# Patient Record
Sex: Female | Born: 1945 | Race: White | Hispanic: No | Marital: Married | Smoking: Former smoker
Health system: Southern US, Community
[De-identification: ages and names within clinical notes are randomized; demographics above are authoritative.]

## PROBLEM LIST (undated history)

## (undated) DIAGNOSIS — D649 Anemia, unspecified: Secondary | ICD-10-CM

## (undated) DIAGNOSIS — C189 Malignant neoplasm of colon, unspecified: Secondary | ICD-10-CM

## (undated) DIAGNOSIS — I10 Essential (primary) hypertension: Secondary | ICD-10-CM

## (undated) DIAGNOSIS — F419 Anxiety disorder, unspecified: Secondary | ICD-10-CM

## (undated) DIAGNOSIS — E559 Vitamin D deficiency, unspecified: Secondary | ICD-10-CM

## (undated) DIAGNOSIS — K631 Perforation of intestine (nontraumatic): Secondary | ICD-10-CM

## (undated) HISTORY — DX: Essential (primary) hypertension: I10

## (undated) HISTORY — DX: Anemia, unspecified: D64.9

## (undated) HISTORY — PX: COLON SURGERY: SHX602

## (undated) HISTORY — PX: BREAST LUMPECTOMY: SHX2

## (undated) HISTORY — DX: Vitamin D deficiency, unspecified: E55.9

## (undated) HISTORY — DX: Anxiety disorder, unspecified: F41.9

---

## 1999-08-22 ENCOUNTER — Encounter: Payer: Self-pay | Admitting: Family Medicine

## 1999-08-22 ENCOUNTER — Encounter (INDEPENDENT_AMBULATORY_CARE_PROVIDER_SITE_OTHER): Payer: Self-pay | Admitting: Specialist

## 1999-08-22 ENCOUNTER — Ambulatory Visit (HOSPITAL_COMMUNITY): Admission: RE | Admit: 1999-08-22 | Discharge: 1999-08-22 | Payer: Self-pay | Admitting: Family Medicine

## 2000-07-14 ENCOUNTER — Ambulatory Visit (HOSPITAL_COMMUNITY): Admission: RE | Admit: 2000-07-14 | Discharge: 2000-07-14 | Payer: Self-pay | Admitting: Family Medicine

## 2000-07-14 ENCOUNTER — Encounter: Payer: Self-pay | Admitting: Family Medicine

## 2001-07-28 ENCOUNTER — Ambulatory Visit (HOSPITAL_COMMUNITY): Admission: RE | Admit: 2001-07-28 | Discharge: 2001-07-28 | Payer: Self-pay | Admitting: Family Medicine

## 2001-07-28 ENCOUNTER — Encounter: Payer: Self-pay | Admitting: Family Medicine

## 2002-08-04 ENCOUNTER — Ambulatory Visit (HOSPITAL_COMMUNITY): Admission: RE | Admit: 2002-08-04 | Discharge: 2002-08-04 | Payer: Self-pay | Admitting: Family Medicine

## 2002-08-04 ENCOUNTER — Encounter: Payer: Self-pay | Admitting: Family Medicine

## 2003-08-07 ENCOUNTER — Ambulatory Visit (HOSPITAL_COMMUNITY): Admission: RE | Admit: 2003-08-07 | Discharge: 2003-08-07 | Payer: Self-pay | Admitting: Family Medicine

## 2004-09-17 ENCOUNTER — Ambulatory Visit (HOSPITAL_COMMUNITY): Admission: RE | Admit: 2004-09-17 | Discharge: 2004-09-17 | Payer: Self-pay | Admitting: Family Medicine

## 2005-10-08 ENCOUNTER — Ambulatory Visit (HOSPITAL_COMMUNITY): Admission: RE | Admit: 2005-10-08 | Discharge: 2005-10-08 | Payer: Self-pay | Admitting: Family Medicine

## 2006-12-31 ENCOUNTER — Ambulatory Visit (HOSPITAL_COMMUNITY): Admission: RE | Admit: 2006-12-31 | Discharge: 2006-12-31 | Payer: Self-pay | Admitting: Family Medicine

## 2007-11-16 ENCOUNTER — Ambulatory Visit (HOSPITAL_COMMUNITY): Admission: RE | Admit: 2007-11-16 | Discharge: 2007-11-16 | Payer: Self-pay | Admitting: Urology

## 2008-01-19 ENCOUNTER — Ambulatory Visit (HOSPITAL_COMMUNITY): Admission: RE | Admit: 2008-01-19 | Discharge: 2008-01-19 | Payer: Self-pay | Admitting: Family Medicine

## 2008-12-21 ENCOUNTER — Encounter: Admission: RE | Admit: 2008-12-21 | Discharge: 2008-12-21 | Payer: Self-pay | Admitting: Family Medicine

## 2009-01-22 ENCOUNTER — Ambulatory Visit (HOSPITAL_COMMUNITY): Admission: RE | Admit: 2009-01-22 | Discharge: 2009-01-22 | Payer: Self-pay | Admitting: Family Medicine

## 2010-03-08 ENCOUNTER — Ambulatory Visit (HOSPITAL_COMMUNITY): Admission: RE | Admit: 2010-03-08 | Discharge: 2010-03-08 | Payer: Self-pay | Admitting: Family Medicine

## 2010-06-16 ENCOUNTER — Encounter: Payer: Self-pay | Admitting: Family Medicine

## 2011-02-26 ENCOUNTER — Other Ambulatory Visit: Payer: Self-pay | Admitting: Family Medicine

## 2011-02-26 DIAGNOSIS — Z1231 Encounter for screening mammogram for malignant neoplasm of breast: Secondary | ICD-10-CM

## 2011-03-12 ENCOUNTER — Ambulatory Visit (HOSPITAL_COMMUNITY)
Admission: RE | Admit: 2011-03-12 | Discharge: 2011-03-12 | Disposition: A | Discharge status: Home / Self Care | Payer: Medicare Other | Source: Ambulatory Visit | Attending: Family Medicine | Admitting: Family Medicine

## 2011-03-12 DIAGNOSIS — Z1231 Encounter for screening mammogram for malignant neoplasm of breast: Secondary | ICD-10-CM

## 2012-03-09 ENCOUNTER — Other Ambulatory Visit: Payer: Self-pay | Admitting: Family Medicine

## 2012-03-09 DIAGNOSIS — Z1231 Encounter for screening mammogram for malignant neoplasm of breast: Secondary | ICD-10-CM

## 2012-03-26 ENCOUNTER — Ambulatory Visit (HOSPITAL_COMMUNITY): Payer: Medicare Other

## 2012-05-27 ENCOUNTER — Ambulatory Visit (HOSPITAL_COMMUNITY): Payer: Medicare Other

## 2012-11-14 ENCOUNTER — Other Ambulatory Visit (HOSPITAL_COMMUNITY): Payer: Self-pay | Admitting: Family Medicine

## 2012-11-20 ENCOUNTER — Other Ambulatory Visit (HOSPITAL_COMMUNITY): Payer: Self-pay | Admitting: Family Medicine

## 2012-11-28 ENCOUNTER — Other Ambulatory Visit (HOSPITAL_COMMUNITY): Payer: Self-pay | Admitting: Family Medicine

## 2012-11-30 NOTE — Telephone Encounter (Signed)
Last seen by ACM 03/13, she has been told several times to be seen

## 2013-01-19 ENCOUNTER — Other Ambulatory Visit (HOSPITAL_COMMUNITY): Payer: Self-pay | Admitting: Family Medicine

## 2013-01-20 NOTE — Telephone Encounter (Signed)
Last seen 08/20/11  ACM

## 2013-02-14 ENCOUNTER — Other Ambulatory Visit (HOSPITAL_COMMUNITY): Payer: Self-pay | Admitting: Family Medicine

## 2013-02-16 NOTE — Telephone Encounter (Signed)
You may give this patient 14 pills which is enough for 2 weeks and schedule her visit to see a provider because she has not been seen since 3-13

## 2013-02-16 NOTE — Telephone Encounter (Signed)
PT NOT SEEN SINCE 3/13. NTBS

## 2013-03-10 ENCOUNTER — Other Ambulatory Visit (HOSPITAL_COMMUNITY): Payer: Self-pay | Admitting: Family Medicine

## 2013-03-15 ENCOUNTER — Other Ambulatory Visit: Payer: Self-pay

## 2013-03-16 ENCOUNTER — Other Ambulatory Visit (HOSPITAL_COMMUNITY): Payer: Self-pay | Admitting: Family Medicine

## 2013-04-24 ENCOUNTER — Other Ambulatory Visit (HOSPITAL_COMMUNITY): Payer: Self-pay | Admitting: Family Medicine

## 2013-04-26 NOTE — Telephone Encounter (Signed)
ntbs

## 2013-04-26 NOTE — Telephone Encounter (Signed)
Last seen 08/20/11  ACM

## 2013-05-24 ENCOUNTER — Other Ambulatory Visit (HOSPITAL_COMMUNITY): Payer: Self-pay | Admitting: Nurse Practitioner

## 2013-06-03 ENCOUNTER — Other Ambulatory Visit: Payer: Self-pay | Admitting: *Deleted

## 2013-06-03 MED ORDER — ATENOLOL 50 MG PO TABS: ORAL_TABLET | ORAL | Status: DC

## 2013-06-03 NOTE — Telephone Encounter (Signed)
No more refills without making an appointment

## 2013-06-03 NOTE — Telephone Encounter (Signed)
Patient has not been seen since 08-20-11. Please advise

## 2013-07-02 ENCOUNTER — Other Ambulatory Visit: Payer: Self-pay | Admitting: Nurse Practitioner

## 2013-07-11 ENCOUNTER — Other Ambulatory Visit: Payer: Self-pay

## 2013-07-12 ENCOUNTER — Other Ambulatory Visit: Payer: Self-pay | Admitting: Nurse Practitioner

## 2013-07-18 ENCOUNTER — Other Ambulatory Visit: Payer: Self-pay | Admitting: *Deleted

## 2013-07-18 ENCOUNTER — Other Ambulatory Visit: Payer: Self-pay | Admitting: Nurse Practitioner

## 2013-07-18 ENCOUNTER — Other Ambulatory Visit (HOSPITAL_COMMUNITY): Payer: Self-pay | Admitting: Family Medicine

## 2013-07-18 MED ORDER — ATENOLOL 50 MG PO TABS: ORAL_TABLET | ORAL | Status: DC

## 2013-07-18 NOTE — Telephone Encounter (Signed)
Please make sure that patient makes an appointment to be seen in follow up for her blood pressure

## 2013-08-15 ENCOUNTER — Other Ambulatory Visit: Payer: Self-pay | Admitting: Family Medicine

## 2013-08-22 ENCOUNTER — Other Ambulatory Visit: Payer: Self-pay | Admitting: Family Medicine

## 2013-08-23 ENCOUNTER — Telehealth: Payer: Self-pay | Admitting: Family Medicine

## 2013-08-23 MED ORDER — ATENOLOL 50 MG PO TABS: ORAL_TABLET | ORAL | Status: DC

## 2013-08-23 NOTE — Telephone Encounter (Signed)
done

## 2013-08-29 ENCOUNTER — Ambulatory Visit: Payer: Self-pay | Admitting: Family Medicine

## 2013-09-19 ENCOUNTER — Other Ambulatory Visit: Payer: Self-pay | Admitting: Family Medicine

## 2013-09-22 ENCOUNTER — Other Ambulatory Visit: Payer: Self-pay | Admitting: Family Medicine

## 2013-10-06 ENCOUNTER — Other Ambulatory Visit: Payer: Self-pay

## 2013-10-07 ENCOUNTER — Telehealth: Payer: Self-pay | Admitting: Family Medicine

## 2013-10-10 NOTE — Telephone Encounter (Signed)
PT MADE APT FOR 10/11/13

## 2013-10-11 ENCOUNTER — Ambulatory Visit (INDEPENDENT_AMBULATORY_CARE_PROVIDER_SITE_OTHER): Payer: Commercial Managed Care - HMO | Admitting: Physician Assistant

## 2013-10-11 ENCOUNTER — Encounter: Payer: Self-pay | Admitting: Physician Assistant

## 2013-10-11 VITALS — BP 157/82 | HR 107 | Temp 99.0°F | Ht 62.0 in | Wt 142.2 lb

## 2013-10-11 DIAGNOSIS — I10 Essential (primary) hypertension: Secondary | ICD-10-CM

## 2013-10-11 DIAGNOSIS — D649 Anemia, unspecified: Secondary | ICD-10-CM

## 2013-10-11 HISTORY — DX: Essential (primary) hypertension: I10

## 2013-10-11 LAB — POCT CBC
Granulocyte percent: 74.5 %G (ref 37–80)
HEMATOCRIT: 30.4 % — AB (ref 37.7–47.9)
HEMOGLOBIN: 10.6 g/dL — AB (ref 12.2–16.2)
Lymph, poc: 1.6 (ref 0.6–3.4)
MCH: 30.4 pg (ref 27–31.2)
MCHC: 35 g/dL (ref 31.8–35.4)
MCV: 86.9 fL (ref 80–97)
MPV: 7.7 fL (ref 0–99.8)
PLATELET COUNT, POC: 254 10*3/uL (ref 142–424)
POC Granulocyte: 6.4 (ref 2–6.9)
POC LYMPH PERCENT: 18.9 %L (ref 10–50)
RBC: 3.5 M/uL — AB (ref 4.04–5.48)
RDW, POC: 14 %
WBC: 8.6 10*3/uL (ref 4.6–10.2)

## 2013-10-11 MED ORDER — ENALAPRIL MALEATE 10 MG PO TABS: ORAL_TABLET | ORAL | Status: DC

## 2013-10-11 MED ORDER — ATENOLOL 50 MG PO TABS: ORAL_TABLET | ORAL | Status: DC

## 2013-10-11 NOTE — Progress Notes (Signed)
Subjective:     Patient ID: Erika Diaz, female   DOB: February 07, 1946, 68 y.o.   MRN: [id number]  HPI Pt here for re-eval of HTN P tout of meds for several days Pt not seen since 2013 in her paper chart She has no complaints  Review of Systems Denies wgt gain/loss No CP/SOB No lower ext edema No change in stamina noted    Objective:   Physical Exam Vitals reviewed No JVD/bruits Heart- RRR w/o M Lungs- CTA bilat Pulses equal in the upper ext No lower ext edema BMP,CBC, and lipid/liver pending    Assessment:     HTN Anemia    Plan:     Discussed with pt given her chronic conditions she will have to be seen on a regular basis Continue with eating well and exercising Will inform of lab results F/U pending labs RF of meds x 6 months

## 2013-10-12 LAB — LIPID PANEL
CHOL/HDL RATIO: 1.7 ratio (ref 0.0–4.4)
CHOLESTEROL TOTAL: 195 mg/dL (ref 100–199)
HDL: 118 mg/dL (ref >39)
LDL CALC: 65 mg/dL (ref 0–99)
TRIGLYCERIDES: 58 mg/dL (ref 0–149)
VLDL CHOLESTEROL CAL: 12 mg/dL (ref 5–40)

## 2013-10-12 LAB — BMP8+EGFR
BUN/Creatinine Ratio: 12 (ref 11–26)
BUN: 12 mg/dL (ref 8–27)
CO2: 20 mmol/L (ref 18–29)
CREATININE: 0.98 mg/dL (ref 0.57–1.00)
Calcium: 9.9 mg/dL (ref 8.7–10.3)
Chloride: 92 mmol/L — ABNORMAL LOW (ref 97–108)
GFR calc Af Amer: 69 mL/min/{1.73_m2} (ref >59)
GFR, EST NON AFRICAN AMERICAN: 59 mL/min/{1.73_m2} — AB (ref >59)
Glucose: 119 mg/dL — ABNORMAL HIGH (ref 65–99)
Potassium: 4.6 mmol/L (ref 3.5–5.2)
SODIUM: 128 mmol/L — AB (ref 134–144)

## 2013-10-12 LAB — HEPATIC FUNCTION PANEL
ALBUMIN: 4.6 g/dL (ref 3.6–4.8)
ALK PHOS: 79 IU/L (ref 39–117)
ALT: 14 IU/L (ref 0–32)
AST: 19 IU/L (ref 0–40)
BILIRUBIN DIRECT: 0.14 mg/dL (ref 0.00–0.40)
BILIRUBIN TOTAL: 0.4 mg/dL (ref 0.0–1.2)
TOTAL PROTEIN: 7.3 g/dL (ref 6.0–8.5)

## 2013-11-27 ENCOUNTER — Emergency Department (HOSPITAL_COMMUNITY): Payer: Medicare HMO

## 2013-11-27 ENCOUNTER — Encounter (HOSPITAL_COMMUNITY): Payer: Self-pay | Admitting: Emergency Medicine

## 2013-11-27 ENCOUNTER — Inpatient Hospital Stay (HOSPITAL_COMMUNITY)
Admission: EM | Admit: 2013-11-27 | Discharge: 2013-12-01 | DRG: 470 | Disposition: A | Discharge status: Skilled Nursing Facility | Payer: Medicare HMO | Attending: Family Medicine | Admitting: Family Medicine

## 2013-11-27 DIAGNOSIS — S72001A Fracture of unspecified part of neck of right femur, initial encounter for closed fracture: Secondary | ICD-10-CM

## 2013-11-27 DIAGNOSIS — S72009A Fracture of unspecified part of neck of unspecified femur, initial encounter for closed fracture: Principal | ICD-10-CM | POA: Diagnosis present

## 2013-11-27 DIAGNOSIS — I10 Essential (primary) hypertension: Secondary | ICD-10-CM | POA: Diagnosis present

## 2013-11-27 DIAGNOSIS — Z91199 Patient's noncompliance with other medical treatment and regimen due to unspecified reason: Secondary | ICD-10-CM

## 2013-11-27 DIAGNOSIS — S7291XA Unspecified fracture of right femur, initial encounter for closed fracture: Secondary | ICD-10-CM

## 2013-11-27 DIAGNOSIS — D649 Anemia, unspecified: Secondary | ICD-10-CM

## 2013-11-27 DIAGNOSIS — D509 Iron deficiency anemia, unspecified: Secondary | ICD-10-CM | POA: Diagnosis present

## 2013-11-27 DIAGNOSIS — D6489 Other specified anemias: Secondary | ICD-10-CM

## 2013-11-27 DIAGNOSIS — F0390 Unspecified dementia without behavioral disturbance: Secondary | ICD-10-CM | POA: Diagnosis present

## 2013-11-27 DIAGNOSIS — Z833 Family history of diabetes mellitus: Secondary | ICD-10-CM

## 2013-11-27 DIAGNOSIS — Z9119 Patient's noncompliance with other medical treatment and regimen: Secondary | ICD-10-CM

## 2013-11-27 DIAGNOSIS — F10920 Alcohol use, unspecified with intoxication, uncomplicated: Secondary | ICD-10-CM

## 2013-11-27 DIAGNOSIS — F1010 Alcohol abuse, uncomplicated: Secondary | ICD-10-CM

## 2013-11-27 DIAGNOSIS — F1020 Alcohol dependence, uncomplicated: Secondary | ICD-10-CM | POA: Diagnosis present

## 2013-11-27 DIAGNOSIS — D72829 Elevated white blood cell count, unspecified: Secondary | ICD-10-CM | POA: Diagnosis present

## 2013-11-27 DIAGNOSIS — E871 Hypo-osmolality and hyponatremia: Secondary | ICD-10-CM

## 2013-11-27 DIAGNOSIS — E876 Hypokalemia: Secondary | ICD-10-CM | POA: Diagnosis not present

## 2013-11-27 DIAGNOSIS — W108XXA Fall (on) (from) other stairs and steps, initial encounter: Secondary | ICD-10-CM | POA: Diagnosis present

## 2013-11-27 DIAGNOSIS — Z87891 Personal history of nicotine dependence: Secondary | ICD-10-CM

## 2013-11-27 HISTORY — DX: Hypo-osmolality and hyponatremia: E87.1

## 2013-11-27 HISTORY — DX: Alcohol abuse, uncomplicated: F10.10

## 2013-11-27 LAB — BASIC METABOLIC PANEL
ANION GAP: 18 — AB (ref 5–15)
Anion gap: 17 — ABNORMAL HIGH (ref 5–15)
BUN: 6 mg/dL (ref 6–23)
BUN: 5 mg/dL — AB (ref 6–23)
CALCIUM: 8.9 mg/dL (ref 8.4–10.5)
CO2: 19 meq/L (ref 19–32)
CO2: 20 mEq/L (ref 19–32)
CREATININE: 0.72 mg/dL (ref 0.50–1.10)
CREATININE: 0.7 mg/dL (ref 0.50–1.10)
Calcium: 8.6 mg/dL (ref 8.4–10.5)
Chloride: 84 mEq/L — ABNORMAL LOW (ref 96–112)
Chloride: 86 mEq/L — ABNORMAL LOW (ref 96–112)
GFR calc Af Amer: >90 mL/min (ref >90)
GFR calc non Af Amer: 86 mL/min — ABNORMAL LOW (ref >90)
GFR, EST NON AFRICAN AMERICAN: 87 mL/min — AB (ref >90)
Glucose, Bld: 136 mg/dL — ABNORMAL HIGH (ref 70–99)
Glucose, Bld: 118 mg/dL — ABNORMAL HIGH (ref 70–99)
Potassium: 3.9 mEq/L (ref 3.7–5.3)
Potassium: 3.9 mEq/L (ref 3.7–5.3)
SODIUM: 121 meq/L — AB (ref 137–147)
Sodium: 123 mEq/L — ABNORMAL LOW (ref 137–147)

## 2013-11-27 LAB — CBC WITH DIFFERENTIAL/PLATELET
Basophils Absolute: 0 10*3/uL (ref 0.0–0.1)
Basophils Relative: 0 % (ref 0–1)
Eosinophils Absolute: 0 10*3/uL (ref 0.0–0.7)
Eosinophils Relative: 0 % (ref 0–5)
HCT: 28.7 % — ABNORMAL LOW (ref 36.0–46.0)
Hemoglobin: 10.1 g/dL — ABNORMAL LOW (ref 12.0–15.0)
LYMPHS ABS: 1.4 10*3/uL (ref 0.7–4.0)
LYMPHS PCT: 11 % — AB (ref 12–46)
MCH: 29.2 pg (ref 26.0–34.0)
MCHC: 35.2 g/dL (ref 30.0–36.0)
MCV: 82.9 fL (ref 78.0–100.0)
Monocytes Absolute: 0.8 10*3/uL (ref 0.1–1.0)
Monocytes Relative: 6 % (ref 3–12)
NEUTROS ABS: 10.8 10*3/uL — AB (ref 1.7–7.7)
NEUTROS PCT: 83 % — AB (ref 43–77)
PLATELETS: 268 10*3/uL (ref 150–400)
RBC: 3.46 MIL/uL — AB (ref 3.87–5.11)
RDW: 14.6 % (ref 11.5–15.5)
WBC: 13.1 10*3/uL — AB (ref 4.0–10.5)

## 2013-11-27 LAB — CBC
HEMATOCRIT: 28.2 % — AB (ref 36.0–46.0)
Hemoglobin: 9.9 g/dL — ABNORMAL LOW (ref 12.0–15.0)
MCH: 29.1 pg (ref 26.0–34.0)
MCHC: 35.1 g/dL (ref 30.0–36.0)
MCV: 82.9 fL (ref 78.0–100.0)
Platelets: 267 10*3/uL (ref 150–400)
RBC: 3.4 MIL/uL — ABNORMAL LOW (ref 3.87–5.11)
RDW: 14.4 % (ref 11.5–15.5)
WBC: 12.5 10*3/uL — ABNORMAL HIGH (ref 4.0–10.5)

## 2013-11-27 LAB — HEPATIC FUNCTION PANEL
ALT: 22 U/L (ref 0–35)
AST: 23 U/L (ref 0–37)
Albumin: 3.6 g/dL (ref 3.5–5.2)
Alkaline Phosphatase: 83 U/L (ref 39–117)
BILIRUBIN TOTAL: 0.2 mg/dL — AB (ref 0.3–1.2)
Total Protein: 7.1 g/dL (ref 6.0–8.3)

## 2013-11-27 LAB — RETICULOCYTES
RBC.: 3.36 MIL/uL — AB (ref 3.87–5.11)
RETIC CT PCT: 1.2 % (ref 0.4–3.1)
Retic Count, Absolute: 40.3 10*3/uL (ref 19.0–186.0)

## 2013-11-27 LAB — FERRITIN: Ferritin: 18 ng/mL (ref 10–291)

## 2013-11-27 LAB — URINALYSIS, ROUTINE W REFLEX MICROSCOPIC
Bilirubin Urine: NEGATIVE
Glucose, UA: NEGATIVE mg/dL
Hgb urine dipstick: NEGATIVE
Ketones, ur: NEGATIVE mg/dL
LEUKOCYTES UA: NEGATIVE
NITRITE: NEGATIVE
PROTEIN: NEGATIVE mg/dL
UROBILINOGEN UA: 0.2 mg/dL (ref 0.0–1.0)
pH: 6 (ref 5.0–8.0)

## 2013-11-27 LAB — SURGICAL PCR SCREEN
MRSA, PCR: NEGATIVE
Staphylococcus aureus: NEGATIVE

## 2013-11-27 LAB — IRON AND TIBC
IRON: 43 ug/dL (ref 42–135)
Saturation Ratios: 14 % — ABNORMAL LOW (ref 20–55)
TIBC: 312 ug/dL (ref 250–470)
UIBC: 269 ug/dL (ref 125–400)

## 2013-11-27 LAB — ETHANOL: ALCOHOL ETHYL (B): 190 mg/dL — AB (ref 0–11)

## 2013-11-27 LAB — FOLATE: Folate: 18.3 ng/mL

## 2013-11-27 LAB — VITAMIN B12: Vitamin B-12: 360 pg/mL (ref 211–911)

## 2013-11-27 LAB — PREPARE RBC (CROSSMATCH)

## 2013-11-27 LAB — ABO/RH: ABO/RH(D): AB POS

## 2013-11-27 LAB — OSMOLALITY: Osmolality: 269 mOsm/kg — ABNORMAL LOW (ref 275–300)

## 2013-11-27 MED ORDER — HYDROCODONE-ACETAMINOPHEN 5-325 MG PO TABS
1.0000 | ORAL_TABLET | Freq: Four times a day (QID) | ORAL | Status: DC | PRN
  Administered 2013-11-27 – 2013-11-30 (×5): 1 via ORAL
  Administered 2013-11-30: 2 via ORAL
  Administered 2013-11-30 – 2013-12-01 (×2): 1 via ORAL
  Filled 2013-11-27 (×8): qty 1
  Filled 2013-11-27: qty 2

## 2013-11-27 MED ORDER — ADULT MULTIVITAMIN W/MINERALS CH
1.0000 | ORAL_TABLET | Freq: Every day | ORAL | Status: DC
  Administered 2013-11-28 – 2013-11-29 (×2): 1 via ORAL
  Filled 2013-11-27 (×4): qty 1

## 2013-11-27 MED ORDER — MORPHINE SULFATE 2 MG/ML IJ SOLN
0.5000 mg | INTRAMUSCULAR | Status: DC | PRN
  Administered 2013-11-27 – 2013-11-29 (×10): 0.5 mg via INTRAVENOUS
  Filled 2013-11-27 (×10): qty 1

## 2013-11-27 MED ORDER — FOLIC ACID 1 MG PO TABS
1.0000 mg | ORAL_TABLET | Freq: Every day | ORAL | Status: DC
  Administered 2013-11-27 – 2013-12-01 (×4): 1 mg via ORAL
  Filled 2013-11-27 (×5): qty 1

## 2013-11-27 MED ORDER — LORAZEPAM 2 MG/ML IJ SOLN: 1.0000 mg | Freq: Four times a day (QID) | INTRAMUSCULAR | Status: AC | PRN

## 2013-11-27 MED ORDER — SODIUM CHLORIDE 0.9 % IV SOLN
INTRAVENOUS | Status: AC
  Administered 2013-11-27: 05:00:00 via INTRAVENOUS

## 2013-11-27 MED ORDER — ONDANSETRON HCL 4 MG/2ML IJ SOLN: 4.0000 mg | Freq: Once | INTRAMUSCULAR | Status: DC

## 2013-11-27 MED ORDER — ONDANSETRON HCL 4 MG/2ML IJ SOLN: 4.0000 mg | Freq: Four times a day (QID) | INTRAMUSCULAR | Status: DC | PRN

## 2013-11-27 MED ORDER — VITAMIN B-1 100 MG PO TABS
100.0000 mg | ORAL_TABLET | Freq: Every day | ORAL | Status: DC
  Administered 2013-11-27 – 2013-12-01 (×4): 100 mg via ORAL
  Filled 2013-11-27 (×5): qty 1

## 2013-11-27 MED ORDER — THIAMINE HCL 100 MG/ML IJ SOLN: 100.0000 mg | Freq: Every day | INTRAMUSCULAR | Status: DC

## 2013-11-27 MED ORDER — ENALAPRIL MALEATE 5 MG PO TABS
10.0000 mg | ORAL_TABLET | Freq: Every day | ORAL | Status: DC
  Administered 2013-11-27 – 2013-12-01 (×4): 10 mg via ORAL
  Filled 2013-11-27 (×5): qty 2

## 2013-11-27 MED ORDER — ONDANSETRON HCL 4 MG/2ML IJ SOLN
4.0000 mg | Freq: Once | INTRAMUSCULAR | Status: AC
  Administered 2013-11-27: 4 mg via INTRAVENOUS
  Filled 2013-11-27: qty 2

## 2013-11-27 MED ORDER — FENTANYL CITRATE 0.05 MG/ML IJ SOLN
50.0000 ug | Freq: Once | INTRAMUSCULAR | Status: AC
  Administered 2013-11-27: 50 ug via INTRAVENOUS
  Filled 2013-11-27: qty 2

## 2013-11-27 MED ORDER — LORAZEPAM 1 MG PO TABS
1.0000 mg | ORAL_TABLET | Freq: Four times a day (QID) | ORAL | Status: AC | PRN
  Administered 2013-11-30: 1 mg via ORAL
  Filled 2013-11-27: qty 1

## 2013-11-27 MED ORDER — LORAZEPAM 2 MG/ML IJ SOLN: 0.0000 mg | Freq: Two times a day (BID) | INTRAMUSCULAR | Status: DC

## 2013-11-27 MED ORDER — ATENOLOL 25 MG PO TABS
50.0000 mg | ORAL_TABLET | Freq: Every day | ORAL | Status: DC
Start: 2013-11-27 — End: 2013-12-01
  Administered 2013-11-27 – 2013-12-01 (×4): 50 mg via ORAL
  Filled 2013-11-27 (×5): qty 2

## 2013-11-27 MED ORDER — HEPARIN SODIUM (PORCINE) 5000 UNIT/ML IJ SOLN
5000.0000 [IU] | Freq: Three times a day (TID) | INTRAMUSCULAR | Status: AC
  Administered 2013-11-27 (×2): 5000 [IU] via SUBCUTANEOUS
  Filled 2013-11-27 (×2): qty 1

## 2013-11-27 MED ORDER — HYDRALAZINE HCL 20 MG/ML IJ SOLN
10.0000 mg | Freq: Four times a day (QID) | INTRAMUSCULAR | Status: DC | PRN
  Administered 2013-11-27: 10 mg via INTRAVENOUS
  Filled 2013-11-27: qty 1

## 2013-11-27 MED ORDER — ONDANSETRON HCL 4 MG/2ML IJ SOLN
4.0000 mg | Freq: Once | INTRAMUSCULAR | Status: AC
  Administered 2013-11-27: 4 mg via INTRAVENOUS

## 2013-11-27 MED ORDER — MORPHINE SULFATE 4 MG/ML IJ SOLN
4.0000 mg | Freq: Once | INTRAMUSCULAR | Status: AC
  Administered 2013-11-27: 4 mg via INTRAVENOUS
  Filled 2013-11-27: qty 1

## 2013-11-27 MED ORDER — LORAZEPAM 2 MG/ML IJ SOLN
0.0000 mg | Freq: Four times a day (QID) | INTRAMUSCULAR | Status: AC
  Administered 2013-11-27: 1 mg via INTRAVENOUS
  Administered 2013-11-29: 2 mg via INTRAVENOUS
  Filled 2013-11-27 (×2): qty 1

## 2013-11-27 MED ORDER — HYDRALAZINE HCL 25 MG PO TABS
25.0000 mg | ORAL_TABLET | Freq: Three times a day (TID) | ORAL | Status: DC | PRN
  Administered 2013-11-27 – 2013-11-29 (×3): 25 mg via ORAL
  Filled 2013-11-27 (×3): qty 1

## 2013-11-27 NOTE — ED Notes (Signed)
MD at bedside. 

## 2013-11-27 NOTE — ED Notes (Signed)
CRITICAL VALUE ALERT  Critical value received:  Na 121  Date of notification:  11/27/13  Time of notification:  0215  Critical value read back: Yes  Nurse who received alert:  Gerlene Burdock, RN  MD notified:  Dr. Lita Mains  Time of notification:  [phone]

## 2013-11-27 NOTE — ED Notes (Signed)
Pt family at bedside. Family member states that pt has been having short term memory loss for roughly 2 months. "she forgets if she has eaten or taken her pills"

## 2013-11-27 NOTE — ED Provider Notes (Signed)
Attempted to contact Dr. Aline Brochure multiple times. Was unsuccessful and discussed with hospitalist. We'll admit to telemetry bed and correct sodium. Discussed with Dr. Ninfa Linden who is on call from [facility]. He states if still unable to contact Dr. Aline Brochure later today, and he will see the patient if transferred to [facility] for hip repair surgery. I have updated the family and patient.  Julianne Rice, MD 11/27/13 [phone]

## 2013-11-27 NOTE — Progress Notes (Signed)
Patient vomiting.  States she vomited once during the night.  Dr. Ree Kida notified.  Zofran prn ordered.

## 2013-11-27 NOTE — ED Notes (Signed)
Per EMS: pt has had several drinks this evening. Walking down the steps off the porch, fell off last step onto right hip. Pt c/o right hip pain, denies LOC, any other pain. Some shortening noted to right leg, no rotation.

## 2013-11-27 NOTE — H&P (Signed)
PCP:   Redge Gainer, MD   Chief Complaint:  Fall  HPI:  68 year old female who  has a past medical history of Hypertension. today was brought to the ED after patient sustained a fall on the right hip. Patient drinks alcohol and has been drinking since yesterday afternoon. As per patient's nephew, patient was holding the nephew son and walking when she lost balance and fell on the right hip. She complained of pain in the hip but denied injury to the head or chest. Patient was brought to the hospital which was found to have right femoral neck fracture on the x-ray. Patient drinks alcohol almost everyday, and she drinks about 6 packs of beer. At this time pain is improved after patient received morphine, she denies chest pain, no shortness of breath, no nausea, she did vomit since he came to the hospital. Patient also found to have hyponatremia with sodium 121.*  Allergies:  No Known Allergies    Past Medical History  Diagnosis Date  . Hypertension     History reviewed. No pertinent past surgical history.  Prior to Admission medications   Medication Sig Start Date End Date Taking? Authorizing Provider  atenolol (TENORMIN) 50 MG tablet TAKE ONE TABLET BY MOUTH ONE TIME DAILY 10/11/13   Lodema Pilot, PA-C  enalapril (VASOTEC) 10 MG tablet TAKE ONE TABLET BY MOUTH ONE TIME DAILY 10/11/13   Lodema Pilot, PA-C    Social History:  reports that she quit smoking about 9 years ago. She does not have any smokeless tobacco history on file. She reports that she drinks alcohol. She reports that she does not use illicit drugs.  Family History  Problem Relation Age of Onset  . Diabetes Mother      All the positives are listed in BOLD  Review of Systems:  HEENT: Headache, blurred vision, runny nose, sore throat Neck: Hypothyroidism, hyperthyroidism,,lymphadenopathy Chest : Shortness of breath, history of COPD, Asthma Heart : Chest pain, history of coronary arterey disease GI:   Nausea, vomiting, diarrhea, constipation, GERD GU: Dysuria, urgency, frequency of urination, hematuria Neuro: Stroke, seizures, syncope Psych: Depression, anxiety, hallucinations   Physical Exam: Blood pressure 169/75, pulse 65, temperature 97.7 F (36.5 C), temperature source Oral, resp. rate 20, SpO2 99.00%. Constitutional:   Patient is a well-developed and well-nourished *female in no acute distress and cooperative with exam. Head: Normocephalic and atraumatic Mouth: Mucus membranes moist Eyes: PERRL, EOMI, conjunctivae normal Neck: Supple, No Thyromegaly Cardiovascular: RRR, S1 normal, S2 normal Pulmonary/Chest: CTAB, no wheezes, rales, or rhonchi Abdominal: Soft. Non-tender, non-distended, bowel sounds are normal, no masses, organomegaly, or guarding present.  Neurological: A&O x3, Strenght is normal and symmetric bilaterally, cranial nerve II-XII are grossly intact, no focal motor deficit, sensory intact to light touch bilaterally.  Extremities : Right lower extremity externally rotated, tenderness at right hip on palpation  Labs on Admission:  Basic Metabolic Panel:  Recent Labs Lab 11/27/13 0139  NA 121*  K 3.9  CL 84*  CO2 19  GLUCOSE 136*  BUN 6  CREATININE 0.72  CALCIUM 8.9   Liver Function Tests:  Recent Labs Lab 11/27/13 0139  AST 23  ALT 22  ALKPHOS 83  BILITOT 0.2*  PROT 7.1  ALBUMIN 3.6   No results found for this basename: LIPASE, AMYLASE,  in the last 168 hours No results found for this basename: AMMONIA,  in the last 168 hours CBC:  Recent Labs Lab 11/27/13 0139  WBC 13.1*  NEUTROABS 10.8*  HGB 10.1*  HCT 28.7*  MCV 82.9  PLT 268   He is Radiological Exams on Admission: Dg Chest 1 View  11/27/2013   CLINICAL DATA:  Fall with hip pain  EXAM: CHEST - 1 VIEW  COMPARISON:  None.  FINDINGS: Normal heart size and mediastinal contours. No acute infiltrate or edema. No effusion or pneumothorax. No acute osseous findings.  IMPRESSION: No active  disease.   Electronically Signed   By: Jorje Guild M.D.   On: 11/27/2013 01:39   Dg Hip Complete Right  11/27/2013   CLINICAL DATA:  Fall.  Hip pain.  EXAM: RIGHT HIP - COMPLETE 2+ VIEW  COMPARISON:  None.  FINDINGS: There is an acute proximal right femur fracture with varus angulation. The fracture is best characterized as a transcervical or basicervical femoral neck fracture. No evidence of pelvic ring disruption. The femoral heads are located.  IMPRESSION: Displaced right femoral neck fracture.   Electronically Signed   By: Jorje Guild M.D.   On: 11/27/2013 01:39   Ct Cervical Spine Wo Contrast  11/27/2013   CLINICAL DATA:  Fall down steps.  Bilateral neck pain.  EXAM: CT CERVICAL SPINE WITHOUT CONTRAST  TECHNIQUE: Multidetector CT imaging of the cervical spine was performed without intravenous contrast. Multiplanar CT image reconstructions were also generated.  COMPARISON:  None.  FINDINGS: Degenerative disc changes in the lower cervical spine. Multilevel bilateral degenerative facet disease. No fracture or subluxation. No epidural or paraspinal hematoma.  IMPRESSION: No acute bony abnormality.   Electronically Signed   By: Rolm Baptise M.D.   On: 11/27/2013 01:59    EKG: Independently reviewed. Normal sinus rhythm   Assessment/Plan Active Problems:   HTN (hypertension)   Closed right hip fracture   Femur fracture, right   Alcohol abuse   Hyponatremia  Right femoral neck fracture Patient will be admitted to the hospital, orthopedics will be consulted in the morning. ED physician has been unable to contact orthopedics on call in [place]. So he called orthopedics on call at [place], Dr. Renard Hamper man recommended to keep the patient in [place] and trying to contact orthopedics in morning. As he would not operate  On the patient to Monday, due to associated alcohol abuse and hyponatremia. Patient will be admitted to telemetry, will give morphine when necessary for pain  Alcohol  abuse Patient will be put on CIWA protocol. Her alcohol level is 190  Hyponatremia Likely due to [facility] potomania, will check serum osmolality.  DVT prophylaxis Heparin  Risk stratification Patient does not have history of CAD, no functional limitation before the fall, patient is moderate risk for surgery  Code status: Full code  Family discussion: Admission, patients condition and plan of care including tests being ordered have been discussed with the patient and *nephew at bedside who indicate understanding and agree with the plan and Code Status.   Time Spent on Admission: 65 minutes  [facility] Pager: [phone] 11/27/2013, 4:29 AM  If 7PM-7AM, please contact night-coverage  [email or web address]  Password [email or web address]

## 2013-11-27 NOTE — Progress Notes (Signed)
Utilization review Completed Latarsha Zani RN BSN   

## 2013-11-27 NOTE — Progress Notes (Signed)
Patient ID: Erika Diaz, female   DOB: 04-05-1946, 68 y.o.   MRN: [id number] ER RECORD INDICATES ATTEMPTS TO REACH ME, NOT ONE MESSAGE ON PHONE OR PAGER [place] PHONE.

## 2013-11-27 NOTE — Consult Note (Signed)
CONSULT: FROM DR : DR Ree Kida  PCP: Redge Gainer, MD     Erika Diaz is an 68 y.o. female.  Chief Complaint:   Fall  HPI:  68 year old female who  has a past medical history of Hypertension. today was brought to the ED after patient sustained a fall on the right hip. Patient drinks alcohol and has been drinking since yesterday afternoon. As per patient's nephew, patient was holding the nephew son and walking when she lost balance and fell on the right hip. She complained of pain in the hip but denied injury to the head or chest. Patient was brought to the hospital which was found to have right femoral neck fracture on the x-ray. Patient drinks alcohol almost everyday, and she drinks about 6 packs of beer. At this time pain is improved after patient received morphine, she denies chest pain, no shortness of breath, no nausea, she did vomit since he came to the hospital. Patient also found to have hyponatremia with sodium 121.*  Allergies:  No Known Allergies   Past Medical History  Diagnosis Date  . Hypertension     Past Surgical History  Procedure Laterality Date  . Breast surgery Right     lump removal     Family History  Problem Relation Age of Onset  . Diabetes Mother     Social History:  reports that she quit smoking about 9 years ago. She does not have any smokeless tobacco history on file. She reports that she drinks alcohol. She reports that she does not use illicit drugs.  Allergies: No Known Allergies  Medications: I have reviewed the patient's current medications.  CBC    Component Value Date/Time   WBC 12.5* 11/27/2013 0601   WBC 8.6 10/11/2013 1739   RBC 3.40* 11/27/2013 0601   RBC 3.5* 10/11/2013 1739   HGB 9.9* 11/27/2013 0601   HGB 10.6* 10/11/2013 1739   HCT 28.2* 11/27/2013 0601   HCT 30.4* 10/11/2013 1739   PLT 267 11/27/2013 0601   MCV 82.9 11/27/2013 0601   MCV 86.9 10/11/2013 1739   MCH 29.1 11/27/2013 0601   MCH 30.4 10/11/2013 1739   MCHC 35.1 11/27/2013  0601   MCHC 35.0 10/11/2013 1739   RDW 14.4 11/27/2013 0601   LYMPHSABS 1.4 11/27/2013 0139   MONOABS 0.8 11/27/2013 0139   EOSABS 0.0 11/27/2013 0139   BASOSABS 0.0 11/27/2013 0139   BMET    Component Value Date/Time   NA 123* 11/27/2013 0601   NA 128* 10/11/2013 1631   K 3.9 11/27/2013 0601   CL 86* 11/27/2013 0601   CO2 20 11/27/2013 0601   GLUCOSE 118* 11/27/2013 0601   GLUCOSE 119* 10/11/2013 1631   BUN 5* 11/27/2013 0601   BUN 12 10/11/2013 1631   CREATININE 0.70 11/27/2013 0601   CALCIUM 8.6 11/27/2013 0601   GFRNONAA 87* 11/27/2013 0601   GFRAA >90 11/27/2013 0601     Dg Chest 1 View  11/27/2013   CLINICAL DATA:  Fall with hip pain  EXAM: CHEST - 1 VIEW  COMPARISON:  None.  FINDINGS: Normal heart size and mediastinal contours. No acute infiltrate or edema. No effusion or pneumothorax. No acute osseous findings.  IMPRESSION: No active disease.   Electronically Signed   By: Jorje Guild M.D.   On: 11/27/2013 01:39   Dg Hip Complete Right  11/27/2013   CLINICAL DATA:  Fall.  Hip pain.  EXAM: RIGHT HIP - COMPLETE 2+ VIEW  COMPARISON:  None.  FINDINGS: There is an acute proximal right femur fracture with varus angulation. The fracture is best characterized as a transcervical or basicervical femoral neck fracture. No evidence of pelvic ring disruption. The femoral heads are located.  IMPRESSION: Displaced right femoral neck fracture.   Electronically Signed   By: Jorje Guild M.D.   On: 11/27/2013 01:39   Ct Cervical Spine Wo Contrast  11/27/2013   CLINICAL DATA:  Fall down steps.  Bilateral neck pain.  EXAM: CT CERVICAL SPINE WITHOUT CONTRAST  TECHNIQUE: Multidetector CT imaging of the cervical spine was performed without intravenous contrast. Multiplanar CT image reconstructions were also generated.  COMPARISON:  None.  FINDINGS: Degenerative disc changes in the lower cervical spine. Multilevel bilateral degenerative facet disease. No fracture or subluxation. No epidural or paraspinal hematoma.  IMPRESSION:  No acute bony abnormality.   Electronically Signed   By: Rolm Baptise M.D.   On: 11/27/2013 01:59    ROS Review of Systems:  HEENT: Headache, blurred vision, runny nose, sore throat Neck: Hypothyroidism, hyperthyroidism,,lymphadenopathy Chest : Shortness of breath, history of COPD, Asthma Heart : Chest pain, history of coronary arterey disease GI:  Nausea, vomiting, diarrhea, constipation, GERD GU: Dysuria, urgency, frequency of urination, hematuria Neuro: Stroke, seizures, syncope Psych: Depression, anxiety, hallucinations  Blood pressure 155/80, pulse 68, temperature 98.7 F (37.1 C), temperature source Oral, resp. rate 20, height 5\' 3"  (1.6 m), weight 144 lb 10 oz (65.6 kg), SpO2 96.00%. Physical Exam  Vital signs are stable as recorded  General appearance is normal, body habitus she has a small frame, ectomorphic body habitus  The patient is alert and oriented x 3  The patient's mood and affect are normal  Gait assessment: She is nonambulatory secondary to hip fracture  The cardiovascular exam reveals normal pulses and temperature without edema or  swelling.  The lymphatic system is negative for palpable lymph nodes  The sensory exam is normal.  There are no pathologic reflexes.  Balance cannot be assessed secondary to hip fracture   Exam of the upper extremities reveal normal alignment, range of motion, stability, grade 5 motor strength, normal skin.  Exam of the left lower extremity reveals normal alignment, range of motion, stability, grade 5 motor strength normal skin  Inspection right lower extremity reveals external rotation deformity with shortening Range of motion could not assess range of motion secondary to pain and fracture Stability knee and ankle joints were reduced stable Motor exam normal muscle tone  Skin normal, no rash, or laceration.   Arther Abbott 11/27/2013, 8:49 AM   X-ray FINDINGS: There is an acute proximal right femur fracture with  varus angulation. The fracture is best characterized as a transcervical or basicervical femoral neck fracture. No evidence of pelvic ring disruption. The femoral heads are located.   IMPRESSION: Displaced right femoral neck fracture.     Electronically Signed   By: Jorje Guild M.D.   On: 11/27/2013 01:39  Diagnosis closed right femoral neck fracture Hyponatremia Anemia Chronic alcoholism Hypertension poor management secondary to patient noncompliance  Plan  #1 correct hyponatremia with normal saline #2 correct anemia with transfusion #3 alcoholism protocol #4 medical management of hypertension #5 bipolar right hip replacement when abnormalities corrected. The risks and benefits of surgery were explained to the patient. This includes but was not limited to dislocation, infection, blood clot, bleeding, leg length discrepancy.  Heparin cam be continued until midnight. N.p.o. after midnight.

## 2013-11-27 NOTE — Progress Notes (Signed)
[facility]                                                                              Patient Demographics  Erika Diaz, is a 68 y.o. female, DOB - 04-02-1946, BZJ:[id number]  Admit date - 11/27/2013   Admitting Physician Oswald Hillock, MD  Outpatient Primary MD for the patient is Redge Gainer, MD  LOS - 0   Chief Complaint  Patient presents with  . Fall  . Hip Pain      HPI 68 year old female past medical history of hypertension and alcohol abuse was brought to emergency department after sustaining a fall on the right hip. Patient drinks alcohol has been drinking since yesterday afternoon. As per her nephew, patient was holding her nephew the son and was walking when she lost her balance and fell on her right hip. Patient complained of pain in the hip but denied injury to the head or chest. Patient was brought to the hospital and was found to have a right femoral neck fracture on x-ray.  Patient drinks alcohol almost everyday, she drinks a sixpack of beer daily. At the time of admission, pain had improved after the patient had received morphine. She denied chest pain, shortness of breath, nausea. She did vomit once since being in the hospital. Patient was found to also have hyponatremia with a sodium of 121 in the emergency department.  Assessment & Plan   Right femoral neck fracture -X-ray of the right hip: Displaced right femoral neck fracture -Orthopedics consulted and appreciated -Continue pain control -Will consult physical therapy as well as occupational therapy after patient's surgery -Patient does not have a history of coronary artery disease, given her alcohol abuse she is moderate risk for surgery.  Alcohol abuse -Continue CIWA protocol. -Alcohol level upon admission was 190. -Patient counseled on cessation.  Hyponatremia and hypochloremia -Likely secondary to dehydration versus possible beer per to mania. -Will continue normal saline and continue to monitor  her BMP.  Hypertension -Currently uncontrolled, may be secondary to pain as well as noncompliance. -Will continue atenolol and enalapril. -Will add on hydralazine as needed.  Normocytic Anemia -Patient's baseline appears to be approximately 10, given her dehydration, suspect hemoglobin will continue to drop due to dilutional component. Due to possible surgery, orthopedic has ordered blood transfusion. -Will order anemia panel.  Code Status: Full  Family Communication: None at bedside  Disposition Plan: Admitted  Time Spent in minutes   30 minutes  Procedures  None  Consults   Orthopedics, Dr. Aline Brochure  DVT Prophylaxis  Heparin  Lab Results  Component Value Date   PLT 267 11/27/2013    Medications  Scheduled Meds: . sodium chloride   Intravenous STAT  . atenolol  50 mg Oral Daily  . enalapril  10 mg Oral Daily  . folic acid  1 mg Oral Daily  . heparin  5,000 Units Subcutaneous 3 times per day  . LORazepam  0-4 mg Intravenous Q6H   Followed by  . [START ON 11/29/2013] LORazepam  0-4 mg Intravenous Q12H  . multivitamin with minerals  1 tablet Oral Daily  . thiamine  100 mg Oral Daily   Or  .  thiamine  100 mg Intravenous Daily   Continuous Infusions:  PRN Meds:.HYDROcodone-acetaminophen, LORazepam, LORazepam, morphine injection  Antibiotics    Anti-infectives   None      Subjective:   Jaton Eilers seen and examined today.  Patient continues to complain of right hip pain. Denies any chest pain, shortness of breath, abdominal pain at this time. Patient or complains of vomiting or nausea.  Objective:   Filed Vitals:   11/27/13 0330 11/27/13 0458 11/27/13 0527 11/27/13 1003  BP: 169/75 199/68 155/80 189/80  Pulse: 65 84 68 75  Temp:  98.7 F (37.1 C)  98.4 F (36.9 C)  TempSrc:  Oral    Resp:    20  Height:  5\' 3"  (1.6 m)    Weight:  65.6 kg (144 lb 10 oz)    SpO2: 99% 96%  97%    Wt Readings from Last 3 Encounters:  11/27/13 65.6 kg (144 lb 10 oz)    10/11/13 64.501 kg (142 lb 3.2 oz)     Intake/Output Summary (Last 24 hours) at 11/27/13 1012 Last data filed at 11/27/13 0900  Gross per 24 hour  Intake   62.5 ml  Output      1 ml  Net   61.5 ml    Exam  General: Well developed, well nourished, NAD, appears stated age  HEENT: NCAT, PERRLA, EOMI, Anicteic Sclera, mucous membranes moist.   Neck: Supple, no JVD, no masses  Cardiovascular: S1 S2 auscultated, no rubs, murmurs or gallops. Regular rate and rhythm.  Respiratory: Clear to auscultation bilaterally with equal chest rise  Abdomen: Soft, nontender, nondistended, + bowel sounds  Extremities: warm dry without cyanosis clubbing or edema.  Pain to palpation of the right hip,  limited ROM due to pain, RLE externally rotated..   Neuro: AAOx3, cranial nerves grossly intact. Strength equal and bilateral in the upper/lower ext.   Skin: Without rashes exudates or nodules  Psych: Normal affect and demeanor with intact judgement and insight  Data Review   Micro Results No results found for this or any previous visit (from the past 240 hour(s)).  Radiology Reports Dg Chest 1 View  11/27/2013   CLINICAL DATA:  Fall with hip pain  EXAM: CHEST - 1 VIEW  COMPARISON:  None.  FINDINGS: Normal heart size and mediastinal contours. No acute infiltrate or edema. No effusion or pneumothorax. No acute osseous findings.  IMPRESSION: No active disease.   Electronically Signed   By: Jorje Guild M.D.   On: 11/27/2013 01:39   Dg Hip Complete Right  11/27/2013   CLINICAL DATA:  Fall.  Hip pain.  EXAM: RIGHT HIP - COMPLETE 2+ VIEW  COMPARISON:  None.  FINDINGS: There is an acute proximal right femur fracture with varus angulation. The fracture is best characterized as a transcervical or basicervical femoral neck fracture. No evidence of pelvic ring disruption. The femoral heads are located.  IMPRESSION: Displaced right femoral neck fracture.   Electronically Signed   By: Jorje Guild M.D.    On: 11/27/2013 01:39   Ct Cervical Spine Wo Contrast  11/27/2013   CLINICAL DATA:  Fall down steps.  Bilateral neck pain.  EXAM: CT CERVICAL SPINE WITHOUT CONTRAST  TECHNIQUE: Multidetector CT imaging of the cervical spine was performed without intravenous contrast. Multiplanar CT image reconstructions were also generated.  COMPARISON:  None.  FINDINGS: Degenerative disc changes in the lower cervical spine. Multilevel bilateral degenerative facet disease. No fracture or subluxation. No epidural or paraspinal hematoma.  IMPRESSION: No acute bony abnormality.   Electronically Signed   By: Rolm Baptise M.D.   On: 11/27/2013 01:59    CBC  Recent Labs Lab 11/27/13 0139 11/27/13 0601  WBC 13.1* 12.5*  HGB 10.1* 9.9*  HCT 28.7* 28.2*  PLT 268 267  MCV 82.9 82.9  MCH 29.2 29.1  MCHC 35.2 35.1  RDW 14.6 14.4  LYMPHSABS 1.4  --   MONOABS 0.8  --   EOSABS 0.0  --   BASOSABS 0.0  --     Chemistries   Recent Labs Lab 11/27/13 0139 11/27/13 0601  NA 121* 123*  K 3.9 3.9  CL 84* 86*  CO2 19 20  GLUCOSE 136* 118*  BUN 6 5*  CREATININE 0.72 0.70  CALCIUM 8.9 8.6  AST 23  --   ALT 22  --   ALKPHOS 83  --   BILITOT 0.2*  --    ------------------------------------------------------------------------------------------------------------------ estimated creatinine clearance is 61.3 ml/min (by C-G formula based on Cr of 0.7). ------------------------------------------------------------------------------------------------------------------ No results found for this basename: HGBA1C,  in the last 72 hours ------------------------------------------------------------------------------------------------------------------ No results found for this basename: CHOL, HDL, LDLCALC, TRIG, CHOLHDL, LDLDIRECT,  in the last 72 hours ------------------------------------------------------------------------------------------------------------------ No results found for this basename: TSH, T4TOTAL, FREET3,  T3FREE, THYROIDAB,  in the last 72 hours ------------------------------------------------------------------------------------------------------------------ No results found for this basename: VITAMINB12, FOLATE, FERRITIN, TIBC, IRON, RETICCTPCT,  in the last 72 hours  Coagulation profile No results found for this basename: INR, PROTIME,  in the last 168 hours  No results found for this basename: DDIMER,  in the last 72 hours  Cardiac Enzymes No results found for this basename: CK, CKMB, TROPONINI, MYOGLOBIN,  in the last 168 hours ------------------------------------------------------------------------------------------------------------------ No components found with this basename: POCBNP,     Gerell Fortson D.O. on 11/27/2013 at 10:12 AM  Between 7am to 7pm - Pager - [phone]  After 7pm go to [email or web address] - password [email or web address]  And look for the night coverage person covering for me after hours  [facility] Office  [phone]

## 2013-11-27 NOTE — ED Provider Notes (Signed)
CSN: [id number]     Arrival date & time 11/27/13  0033 History   First MD Initiated Contact with Patient 11/27/13 0036     Chief Complaint  Patient presents with  . Fall  . Hip Pain     (Consider location/radiation/quality/duration/timing/severity/associated sxs/prior Treatment) HPI Comments: Patient is a 68 year old female who presents to the emergency department by EMS after sustaining a fall from on the right hip. The patient states that she's had several drinks during the day and this evening. She does not recall how many. She was walking down steps when she missed the last step and fell primarily on her hip. The patient denies any injury to the head or chest. She denies any other injuries. She's not had any previous operations or procedures involving the right hip or pelvis. The patient denies being on any anticoagulation medications. She states that she is treated for hypertension, but otherwise no other significant medical problems.  Patient is a 68 y.o. female presenting with fall and hip pain. The history is provided by the patient and the EMS personnel.  Fall This is a new problem. The current episode started today. Pertinent negatives include no abdominal pain, arthralgias, chest pain, coughing or neck pain.  Hip Pain Pertinent negatives include no abdominal pain, arthralgias, chest pain, coughing or neck pain.    Past Medical History  Diagnosis Date  . Hypertension    History reviewed. No pertinent past surgical history. Family History  Problem Relation Age of Onset  . Diabetes Mother    History  Substance Use Topics  . Smoking status: Former Smoker    Quit date: 05/26/2004  . Smokeless tobacco: Not on file  . Alcohol Use: Yes     Comment: Beer occ.   OB History   Grav Para Term Preterm Abortions TAB SAB Ect Mult Living                 Review of Systems  Constitutional: Negative for activity change.       All ROS Neg except as noted in HPI  HENT: Negative for  nosebleeds.   Eyes: Negative for photophobia and discharge.  Respiratory: Negative for cough, shortness of breath and wheezing.   Cardiovascular: Negative for chest pain and palpitations.  Gastrointestinal: Negative for abdominal pain and blood in stool.  Genitourinary: Negative for dysuria, frequency and hematuria.  Musculoskeletal: Negative for arthralgias, back pain and neck pain.  Skin: Negative.   Neurological: Negative for dizziness, seizures and speech difficulty.  Psychiatric/Behavioral: Negative for hallucinations and confusion.      Allergies  Review of patient's allergies indicates no known allergies.  Home Medications   Prior to Admission medications   Medication Sig Start Date End Date Taking? Authorizing Provider  atenolol (TENORMIN) 50 MG tablet TAKE ONE TABLET BY MOUTH ONE TIME DAILY 10/11/13   Lodema Pilot, PA-C  enalapril (VASOTEC) 10 MG tablet TAKE ONE TABLET BY MOUTH ONE TIME DAILY 10/11/13   Lodema Pilot, PA-C   BP 167/81  Pulse 68  Temp(Src) 97.7 F (36.5 C) (Oral)  Resp 20  SpO2 97% Physical Exam  Nursing note and vitals reviewed. Constitutional: She is oriented to person, place, and time. She appears well-developed and well-nourished.  Non-toxic appearance.  HENT:  Head: Normocephalic and atraumatic.  Right Ear: Tympanic membrane and external ear normal.  Left Ear: Tympanic membrane and external ear normal.  Eyes: EOM and lids are normal. Pupils are equal, round, and reactive to light.  Neck:  Carotid bruit is not present.  Patient is in a cervical collar. No palpable step off appreciated on limited examination.  Cardiovascular: Normal rate, regular rhythm, normal heart sounds, intact distal pulses and normal pulses.   Pulmonary/Chest: Breath sounds normal. No respiratory distress.  Abdominal: Soft. Bowel sounds are normal. There is no tenderness. There is no rebound and no guarding.  Musculoskeletal: Normal range of motion.  There is pain to  palpation or any attempted movement of the right hip area. There is no pain noted of the left hip. The patient has soreness involving the right thigh area. No pain of the right or left knee, no pain of the right or left tibial area. No pain of the right or left ankle or foot. There is shortening of the right lower extremity with what appears to be some moderate rotation.  Lymphadenopathy:       Head (right side): No submandibular adenopathy present.       Head (left side): No submandibular adenopathy present.  Neurological: She is alert and oriented to person, place, and time. She has normal strength. No cranial nerve deficit or sensory deficit.  Skin: Skin is warm and dry.  Psychiatric: She has a normal mood and affect. Her speech is normal.    ED Course  Pt cleared from LSB by me.  Procedures (including critical care time) Labs Review Labs Reviewed  CBC WITH DIFFERENTIAL  BASIC METABOLIC PANEL  ETHANOL  HEPATIC FUNCTION PANEL  URINALYSIS, ROUTINE W REFLEX MICROSCOPIC    Imaging Review No results found.   EKG Interpretation None      MDM   Suspect right hip fracture. Pt has ETOH on board. Xrays and labs pending. Pt care to be continued by Dr. Lita Mains.   Final diagnoses:  None    *I have reviewed nursing notes, vital signs, and all appropriate lab and imaging results for this patient.Lenox Ahr, PA-C 11/27/13 1756

## 2013-11-28 ENCOUNTER — Encounter (HOSPITAL_COMMUNITY): Payer: Self-pay | Admitting: *Deleted

## 2013-11-28 ENCOUNTER — Inpatient Hospital Stay (HOSPITAL_COMMUNITY): Payer: Medicare HMO | Admitting: Anesthesiology

## 2013-11-28 ENCOUNTER — Encounter (HOSPITAL_COMMUNITY): Payer: Medicare HMO | Admitting: Anesthesiology

## 2013-11-28 ENCOUNTER — Inpatient Hospital Stay (HOSPITAL_COMMUNITY): Payer: Medicare HMO

## 2013-11-28 ENCOUNTER — Encounter (HOSPITAL_COMMUNITY)
Admission: EM | Disposition: A | Discharge status: Skilled Nursing Facility | Payer: Self-pay | Source: Home / Self Care | Attending: Internal Medicine

## 2013-11-28 DIAGNOSIS — F0390 Unspecified dementia without behavioral disturbance: Secondary | ICD-10-CM | POA: Diagnosis not present

## 2013-11-28 DIAGNOSIS — S72009A Fracture of unspecified part of neck of unspecified femur, initial encounter for closed fracture: Secondary | ICD-10-CM

## 2013-11-28 DIAGNOSIS — D6489 Other specified anemias: Secondary | ICD-10-CM

## 2013-11-28 DIAGNOSIS — I10 Essential (primary) hypertension: Secondary | ICD-10-CM | POA: Diagnosis not present

## 2013-11-28 DIAGNOSIS — E871 Hypo-osmolality and hyponatremia: Secondary | ICD-10-CM | POA: Diagnosis not present

## 2013-11-28 HISTORY — DX: Fracture of unspecified part of neck of unspecified femur, initial encounter for closed fracture: S72.009A

## 2013-11-28 HISTORY — PX: HIP ARTHROPLASTY: SHX981

## 2013-11-28 LAB — BASIC METABOLIC PANEL
ANION GAP: 14 (ref 5–15)
BUN: 9 mg/dL (ref 6–23)
CHLORIDE: 91 meq/L — AB (ref 96–112)
CO2: 22 mEq/L (ref 19–32)
Calcium: 8.9 mg/dL (ref 8.4–10.5)
Creatinine, Ser: 0.7 mg/dL (ref 0.50–1.10)
GFR calc Af Amer: >90 mL/min (ref >90)
GFR calc non Af Amer: 87 mL/min — ABNORMAL LOW (ref >90)
Glucose, Bld: 117 mg/dL — ABNORMAL HIGH (ref 70–99)
Potassium: 3.7 mEq/L (ref 3.7–5.3)
Sodium: 127 mEq/L — ABNORMAL LOW (ref 137–147)

## 2013-11-28 LAB — CBC
HCT: 36.8 % (ref 36.0–46.0)
HEMOGLOBIN: 13.1 g/dL (ref 12.0–15.0)
MCH: 30.1 pg (ref 26.0–34.0)
MCHC: 35.6 g/dL (ref 30.0–36.0)
MCV: 84.6 fL (ref 78.0–100.0)
PLATELETS: 218 10*3/uL (ref 150–400)
RBC: 4.35 MIL/uL (ref 3.87–5.11)
RDW: 14.4 % (ref 11.5–15.5)
WBC: 12.1 10*3/uL — AB (ref 4.0–10.5)

## 2013-11-28 LAB — APTT: aPTT: 31 seconds (ref 24–37)

## 2013-11-28 LAB — PROTIME-INR
INR: 1.05 (ref 0.00–1.49)
Prothrombin Time: 13.7 seconds (ref 11.6–15.2)

## 2013-11-28 SURGERY — HEMIARTHROPLASTY, HIP, DIRECT ANTERIOR APPROACH, FOR FRACTURE
Anesthesia: Spinal | Site: Hip | Laterality: Right

## 2013-11-28 MED ORDER — MIDAZOLAM HCL 2 MG/2ML IJ SOLN
INTRAMUSCULAR | Status: AC
  Filled 2013-11-28: qty 2

## 2013-11-28 MED ORDER — SODIUM CHLORIDE 0.9 % IV SOLN
INTRAVENOUS | Status: DC
  Administered 2013-11-28 – 2013-11-30 (×4): via INTRAVENOUS

## 2013-11-28 MED ORDER — ACETAMINOPHEN 325 MG PO TABS: 650.0000 mg | ORAL_TABLET | Freq: Four times a day (QID) | ORAL | Status: DC | PRN

## 2013-11-28 MED ORDER — EPHEDRINE SULFATE 50 MG/ML IJ SOLN
INTRAMUSCULAR | Status: DC | PRN
  Administered 2013-11-28: 10 mg via INTRAVENOUS
  Administered 2013-11-28 (×3): 5 mg via INTRAVENOUS

## 2013-11-28 MED ORDER — EPHEDRINE SULFATE 50 MG/ML IJ SOLN
INTRAMUSCULAR | Status: AC
  Filled 2013-11-28: qty 2

## 2013-11-28 MED ORDER — SODIUM CHLORIDE 0.9 % IV SOLN
INTRAVENOUS | Status: DC
  Administered 2013-11-28 (×2): via INTRAVENOUS

## 2013-11-28 MED ORDER — CEFAZOLIN SODIUM-DEXTROSE 2-3 GM-% IV SOLR
2.0000 g | INTRAVENOUS | Status: AC
  Administered 2013-11-28: 2 g via INTRAVENOUS

## 2013-11-28 MED ORDER — FENTANYL CITRATE 0.05 MG/ML IJ SOLN: 25.0000 ug | INTRAMUSCULAR | Status: DC | PRN

## 2013-11-28 MED ORDER — VECURONIUM BROMIDE 10 MG IV SOLR
INTRAVENOUS | Status: AC
  Filled 2013-11-28: qty 10

## 2013-11-28 MED ORDER — BUPIVACAINE IN DEXTROSE 0.75-8.25 % IT SOLN
INTRATHECAL | Status: AC
  Filled 2013-11-28: qty 2

## 2013-11-28 MED ORDER — PROPOFOL 10 MG/ML IV BOLUS
INTRAVENOUS | Status: AC
  Filled 2013-11-28: qty 20

## 2013-11-28 MED ORDER — CEFAZOLIN SODIUM-DEXTROSE 2-3 GM-% IV SOLR
2.0000 g | Freq: Four times a day (QID) | INTRAVENOUS | Status: AC
  Administered 2013-11-28 – 2013-11-29 (×2): 2 g via INTRAVENOUS
  Filled 2013-11-28 (×2): qty 50

## 2013-11-28 MED ORDER — ONDANSETRON HCL 4 MG PO TABS: 4.0000 mg | ORAL_TABLET | Freq: Four times a day (QID) | ORAL | Status: DC | PRN

## 2013-11-28 MED ORDER — LABETALOL HCL 5 MG/ML IV SOLN
INTRAVENOUS | Status: AC
  Filled 2013-11-28: qty 4

## 2013-11-28 MED ORDER — ONDANSETRON HCL 4 MG/2ML IJ SOLN: 4.0000 mg | Freq: Once | INTRAMUSCULAR | Status: DC | PRN

## 2013-11-28 MED ORDER — DOCUSATE SODIUM 100 MG PO CAPS
100.0000 mg | ORAL_CAPSULE | Freq: Two times a day (BID) | ORAL | Status: DC
Start: 2013-11-28 — End: 2013-12-01
  Administered 2013-11-28 – 2013-11-30 (×4): 100 mg via ORAL
  Filled 2013-11-28 (×5): qty 1

## 2013-11-28 MED ORDER — CHLORHEXIDINE GLUCONATE 4 % EX LIQD: 60.0000 mL | Freq: Once | CUTANEOUS | Status: DC

## 2013-11-28 MED ORDER — LIDOCAINE HCL (PF) 1 % IJ SOLN
INTRAMUSCULAR | Status: AC
  Filled 2013-11-28: qty 5

## 2013-11-28 MED ORDER — SENNA 8.6 MG PO TABS
1.0000 | ORAL_TABLET | Freq: Two times a day (BID) | ORAL | Status: DC
  Administered 2013-11-28 – 2013-12-01 (×5): 8.6 mg via ORAL
  Filled 2013-11-28 (×6): qty 1

## 2013-11-28 MED ORDER — BISACODYL 10 MG RE SUPP: 10.0000 mg | Freq: Every day | RECTAL | Status: DC | PRN

## 2013-11-28 MED ORDER — FENTANYL CITRATE 0.05 MG/ML IJ SOLN
INTRAMUSCULAR | Status: DC | PRN
  Administered 2013-11-28: 12.5 ug via INTRAVENOUS
  Administered 2013-11-28 (×2): 25 ug via INTRAVENOUS
  Administered 2013-11-28 (×2): 12.5 ug via INTRAVENOUS

## 2013-11-28 MED ORDER — BUPIVACAINE IN DEXTROSE 0.75-8.25 % IT SOLN
INTRATHECAL | Status: DC | PRN
  Administered 2013-11-28: 15 mg via INTRATHECAL

## 2013-11-28 MED ORDER — ENOXAPARIN SODIUM 30 MG/0.3ML ~~LOC~~ SOLN
30.0000 mg | SUBCUTANEOUS | Status: DC
  Administered 2013-11-29 – 2013-12-01 (×3): 30 mg via SUBCUTANEOUS
  Filled 2013-11-28 (×3): qty 0.3

## 2013-11-28 MED ORDER — FENTANYL CITRATE 0.05 MG/ML IJ SOLN
INTRAMUSCULAR | Status: AC
  Filled 2013-11-28: qty 2

## 2013-11-28 MED ORDER — CEFAZOLIN SODIUM-DEXTROSE 2-3 GM-% IV SOLR
INTRAVENOUS | Status: AC
  Filled 2013-11-28: qty 50

## 2013-11-28 MED ORDER — METOCLOPRAMIDE HCL 10 MG PO TABS: 5.0000 mg | ORAL_TABLET | Freq: Three times a day (TID) | ORAL | Status: DC | PRN

## 2013-11-28 MED ORDER — FENTANYL CITRATE 0.05 MG/ML IJ SOLN
INTRAMUSCULAR | Status: DC | PRN
  Administered 2013-11-28: 12.5 ug via INTRATHECAL

## 2013-11-28 MED ORDER — BUPIVACAINE-EPINEPHRINE (PF) 0.5% -1:200000 IJ SOLN
INTRAMUSCULAR | Status: DC | PRN
  Administered 2013-11-28: 60 mL

## 2013-11-28 MED ORDER — FLEET ENEMA 7-19 GM/118ML RE ENEM
1.0000 | ENEMA | Freq: Once | RECTAL | Status: AC | PRN
Start: 2013-11-28 — End: 2013-11-28

## 2013-11-28 MED ORDER — ONDANSETRON HCL 4 MG/2ML IJ SOLN
4.0000 mg | Freq: Once | INTRAMUSCULAR | Status: AC
  Administered 2013-11-28: 4 mg via INTRAVENOUS

## 2013-11-28 MED ORDER — METOCLOPRAMIDE HCL 5 MG/ML IJ SOLN: 5.0000 mg | Freq: Three times a day (TID) | INTRAMUSCULAR | Status: DC | PRN

## 2013-11-28 MED ORDER — POLYETHYLENE GLYCOL 3350 17 G PO PACK: 17.0000 g | PACK | Freq: Every day | ORAL | Status: DC | PRN

## 2013-11-28 MED ORDER — ONDANSETRON HCL 4 MG/2ML IJ SOLN
4.0000 mg | Freq: Four times a day (QID) | INTRAMUSCULAR | Status: DC | PRN
  Administered 2013-11-30 (×2): 4 mg via INTRAVENOUS
  Filled 2013-11-28 (×2): qty 2

## 2013-11-28 MED ORDER — SODIUM CHLORIDE 0.9 % IJ SOLN
INTRAMUSCULAR | Status: AC
  Filled 2013-11-28: qty 10

## 2013-11-28 MED ORDER — LIDOCAINE HCL (CARDIAC) 10 MG/ML IV SOLN
INTRAVENOUS | Status: DC | PRN
  Administered 2013-11-28: 20 mg via INTRAVENOUS

## 2013-11-28 MED ORDER — MIDAZOLAM HCL 2 MG/2ML IJ SOLN
1.0000 mg | INTRAMUSCULAR | Status: DC | PRN
Start: 2013-11-28 — End: 2013-11-28
  Administered 2013-11-28: 2 mg via INTRAVENOUS

## 2013-11-28 MED ORDER — PHENOL 1.4 % MT LIQD: 1.0000 | OROMUCOSAL | Status: DC | PRN

## 2013-11-28 MED ORDER — ALUM & MAG HYDROXIDE-SIMETH 200-200-20 MG/5ML PO SUSP: 30.0000 mL | ORAL | Status: DC | PRN

## 2013-11-28 MED ORDER — METHOCARBAMOL 500 MG PO TABS: 500.0000 mg | ORAL_TABLET | Freq: Four times a day (QID) | ORAL | Status: DC | PRN

## 2013-11-28 MED ORDER — SODIUM CHLORIDE 0.9 % IR SOLN
Status: DC | PRN
  Administered 2013-11-28 (×2): 1000 mL

## 2013-11-28 MED ORDER — MENTHOL 3 MG MT LOZG: 1.0000 | LOZENGE | OROMUCOSAL | Status: DC | PRN

## 2013-11-28 MED ORDER — PROPOFOL INFUSION 10 MG/ML OPTIME
INTRAVENOUS | Status: DC | PRN
  Administered 2013-11-28: 20 ug/kg/min via INTRAVENOUS
  Administered 2013-11-28: 150 ug/kg/min via INTRAVENOUS

## 2013-11-28 MED ORDER — FENTANYL CITRATE 0.05 MG/ML IJ SOLN
25.0000 ug | INTRAMUSCULAR | Status: AC
  Administered 2013-11-28 (×2): 25 ug via INTRAVENOUS

## 2013-11-28 MED ORDER — ONDANSETRON HCL 4 MG/2ML IJ SOLN
INTRAMUSCULAR | Status: AC
  Filled 2013-11-28: qty 2

## 2013-11-28 MED ORDER — ACETAMINOPHEN 650 MG RE SUPP: 650.0000 mg | Freq: Four times a day (QID) | RECTAL | Status: DC | PRN

## 2013-11-28 MED ORDER — PROPOFOL 10 MG/ML IV BOLUS
INTRAVENOUS | Status: DC | PRN
  Administered 2013-11-28 (×2): 6.5 mg via INTRAVENOUS

## 2013-11-28 MED ORDER — LABETALOL HCL 5 MG/ML IV SOLN
10.0000 mg | INTRAVENOUS | Status: DC | PRN
  Administered 2013-11-28: 10 mg via INTRAVENOUS

## 2013-11-28 MED ORDER — METHOCARBAMOL 1000 MG/10ML IJ SOLN
500.0000 mg | Freq: Four times a day (QID) | INTRAVENOUS | Status: DC | PRN
  Filled 2013-11-28: qty 5

## 2013-11-28 MED ORDER — BUPIVACAINE-EPINEPHRINE (PF) 0.5% -1:200000 IJ SOLN
INTRAMUSCULAR | Status: AC
  Filled 2013-11-28: qty 60

## 2013-11-28 SURGICAL SUPPLY — 63 items
BAG HAMPER (MISCELLANEOUS) ×3 IMPLANT
BIT DRILL 2.8X128 (BIT) ×2 IMPLANT
BIT DRILL 2.8X128MM (BIT) ×1
BLADE 10 SAFETY STRL DISP (BLADE) ×3 IMPLANT
BLADE HEX COATED 2.75 (ELECTRODE) ×3 IMPLANT
BLADE SAGITTAL 25.0X1.27X90 (BLADE) ×2 IMPLANT
BLADE SAGITTAL 25.0X1.27X90MM (BLADE) ×1
BRUSH FEMORAL CANAL (MISCELLANEOUS) IMPLANT
CAPT HIP FX BIPOLAR/UNIPOLAR ×3 IMPLANT
CHLORAPREP W/TINT 26ML (MISCELLANEOUS) ×6 IMPLANT
CLOTH BEACON ORANGE TIMEOUT ST (SAFETY) ×3 IMPLANT
COVER LIGHT HANDLE STERIS (MISCELLANEOUS) ×6 IMPLANT
COVER PROBE W GEL 5X96 (DRAPES) ×3 IMPLANT
DECANTER SPIKE VIAL GLASS SM (MISCELLANEOUS) ×6 IMPLANT
DRAPE HIP W/POCKET STRL (DRAPE) ×3 IMPLANT
DRAPE INCISE IOBAN 66X45 STRL (DRAPES) ×3 IMPLANT
DRSG MEPILEX BORDER 4X12 (GAUZE/BANDAGES/DRESSINGS) ×3 IMPLANT
ELECT REM PT RETURN 9FT ADLT (ELECTROSURGICAL) ×3
ELECTRODE REM PT RTRN 9FT ADLT (ELECTROSURGICAL) ×1 IMPLANT
EVACUATOR 3/16  PVC DRAIN (DRAIN)
EVACUATOR 3/16 PVC DRAIN (DRAIN) IMPLANT
FACESHIELD LNG OPTICON STERILE (SAFETY) ×3 IMPLANT
GLOVE BIO SURGEON STRL SZ 6.5 (GLOVE) ×2 IMPLANT
GLOVE BIO SURGEON STRL SZ7 (GLOVE) ×3 IMPLANT
GLOVE BIO SURGEONS STRL SZ 6.5 (GLOVE) ×1
GLOVE BIOGEL PI IND STRL 7.0 (GLOVE) ×2 IMPLANT
GLOVE BIOGEL PI IND STRL 7.5 (GLOVE) ×2 IMPLANT
GLOVE BIOGEL PI INDICATOR 7.0 (GLOVE) ×4
GLOVE BIOGEL PI INDICATOR 7.5 (GLOVE) ×4
GLOVE SKINSENSE NS SZ8.0 LF (GLOVE) ×4
GLOVE SKINSENSE STRL SZ8.0 LF (GLOVE) ×2 IMPLANT
GLOVE SS N UNI LF 8.5 STRL (GLOVE) ×3 IMPLANT
GOWN STRL REUS W/TWL LRG LVL3 (GOWN DISPOSABLE) ×6 IMPLANT
GOWN STRL REUS W/TWL XL LVL3 (GOWN DISPOSABLE) ×3 IMPLANT
INST SET MAJOR BONE (KITS) ×3 IMPLANT
KIT BLADEGUARD II DBL (SET/KITS/TRAYS/PACK) ×3 IMPLANT
KIT ROOM TURNOVER APOR (KITS) ×3 IMPLANT
MANIFOLD NEPTUNE II (INSTRUMENTS) ×3 IMPLANT
MARKER SKIN DUAL TIP RULER LAB (MISCELLANEOUS) ×3 IMPLANT
NEEDLE HYPO 21X1.5 SAFETY (NEEDLE) ×3 IMPLANT
NS IRRIG 1000ML POUR BTL (IV SOLUTION) ×6 IMPLANT
PACK ARTHRO LIMB DRAPE STRL (MISCELLANEOUS) ×3 IMPLANT
PACK TOTAL JOINT (CUSTOM PROCEDURE TRAY) ×3 IMPLANT
PAD ARMBOARD 7.5X6 YLW CONV (MISCELLANEOUS) ×3 IMPLANT
PASSER SUT SWANSON 36MM LOOP (INSTRUMENTS) ×3 IMPLANT
PILLOW HIP ABDUCTION LRG (ORTHOPEDIC SUPPLIES) IMPLANT
PILLOW HIP ABDUCTION MED (ORTHOPEDIC SUPPLIES) ×3 IMPLANT
PIN STMN 9X.142 IN (PIN) ×3 IMPLANT
SET BASIN LINEN APH (SET/KITS/TRAYS/PACK) ×3 IMPLANT
STAPLER VISISTAT 35W (STAPLE) ×3 IMPLANT
SUT BRALON NAB BRD #1 30IN (SUTURE) ×6 IMPLANT
SUT ETHIBOND 5 LR DA (SUTURE) ×6 IMPLANT
SUT MNCRL 0 VIOLET CTX 36 (SUTURE) ×2 IMPLANT
SUT MON AB 2-0 CT1 36 (SUTURE) IMPLANT
SUT MONOCRYL 0 CTX 36 (SUTURE) ×4
SUT VIC AB 1 CT1 27 (SUTURE) ×6
SUT VIC AB 1 CT1 27XBRD ANTBC (SUTURE) ×2 IMPLANT
SYR 30ML LL (SYRINGE) ×3 IMPLANT
SYR BULB IRRIGATION 50ML (SYRINGE) ×3 IMPLANT
TOWER CARTRIDGE SMART MIX (DISPOSABLE) IMPLANT
TRAY FOLEY CATH 16FR SILVER (SET/KITS/TRAYS/PACK) IMPLANT
WATER STERILE IRR 1000ML POUR (IV SOLUTION) ×6 IMPLANT
YANKAUER SUCT 12FT TUBE ARGYLE (SUCTIONS) ×3 IMPLANT

## 2013-11-28 NOTE — Anesthesia Procedure Notes (Addendum)
Date/Time: 11/28/2013 12:16 PM Performed by: Vista Deck Pre-anesthesia Checklist: Patient identified, Emergency Drugs available, Suction available, Timeout performed and Patient being monitored Patient Re-evaluated:Patient Re-evaluated prior to inductionOxygen Delivery Method: Nasal Cannula    Date/Time: 11/28/2013 12:30 PM Performed by: Vista Deck Pre-anesthesia Checklist: Patient identified, Emergency Drugs available, Suction available, Timeout performed and Patient being monitored Patient Re-evaluated:Patient Re-evaluated prior to inductionOxygen Delivery Method: Non-rebreather mask    Spinal  Patient location during procedure: OR Start time: 11/28/2013 12:29 PM End time: 11/28/2013 12:32 PM Staffing CRNA/Resident: Drucie Opitz S Preanesthetic Checklist Completed: patient identified, site marked, surgical consent, pre-op evaluation, timeout performed, IV checked, risks and benefits discussed and monitors and equipment checked Spinal Block Patient position: right lateral decubitus Prep: Betadine and prepx 3 Patient monitoring: heart rate, cardiac monitor, continuous pulse ox and blood pressure Approach: right paramedian Location: L3-4 Injection technique: single-shot Needle Needle type: Spinocan  Needle gauge: 22 G Needle length: 9 cm Assessment Sensory level: T6 Additional Notes ATTEMPTS: 1 TRAY ID: [id number] TRAY EXPIRATION [id number]

## 2013-11-28 NOTE — Op Note (Signed)
11/27/2013 - 11/28/2013  2:16 PM  PATIENT:  Erika Diaz  68 y.o. female  PRE-OPERATIVE DIAGNOSIS:  closed right femoral neck fracture  POST-OPERATIVE DIAGNOSIS:  closed right femoral neck fracture  PROCEDURE:  Procedure(s): PARTIAL RIGHT HIP REPLACEMENT (BIPOLAR) (Right)  DEPUY HIP PROSTHESIS SIZE 4 FEMUR, SIZE 44 HEAD WITH 1.5 NECK LENGTH  DIRECT LATERAL APPROACH   SURGEON:  Surgeon(s) and Role:    * Carole Civil, MD - Primary  PHYSICIAN ASSISTANT:   ASSISTANTS: DEBBIE DALLAS    ANESTHESIA:   spinal  EBL:  Total I/O In: 1000 [I.V.:1000] Out: 600 [Urine:550; Blood:50]  BLOOD ADMINISTERED:none  DRAINS: none   LOCAL MEDICATIONS USED:  MARCAINE     SPECIMEN:  No Specimen  DISPOSITION OF SPECIMEN:  N/A  COUNTS:  YES  TOURNIQUET:  * No tourniquets in log *  DICTATION: .Dragon Dictation  PLAN OF CARE: Admit to inpatient   PATIENT DISPOSITION:  PACU - hemodynamically stable.   Delay start of Pharmacological VTE agent (>24hrs) due to surgical blood loss or risk of bleeding: yes  DETAILS OF PROCEDURE:  The patient was identified in the preop holding area and the surgical site was marked and countersigned by the surgeon, the chart was updated. The consent was signed.  The patient was taken to the operating room for spinal anesthetic and then placed in the lateral decubitus position with appropriate padding and axillary roll. 2g of ANCEF were given because of the patient's weight of < 80 KG   After sterile prep and drape the timeout was executed  A lateral incision was made centered over the RIGHT greater trochanter to perform a direct lateral approach to the hip. After dividing the subcutaneous tissue down to the fascia, the fascia was split in line with the skin incision. Electrocautery was used to obtain hemostasis.   After deep retractors were placed, the gluteus medius was defined and the anterior half was peeled from the group trochanter in continuity with  the vastus lateralis; the anterior branch to the femoral circumflex artery was cauterized. Subperiosteal dissection continued until the gluteus minimus along with the gluteus medius was retracted proximally.  2 Steinmann pins were placed in the pelvis to retract the Glutei. The hip was dislocated anteriorly, a provisional femoral neck cut was made using the cutting guide. The lesser trochanter was then identified with further soft tissue dissection and a second femoral neck cut was made using the same guide.   A box osteotome was used to lateralize entry point into the femoral canal, this was followed by a femoral canal finder and subsequent broaching up to a size 4 femur. THE HEAD WAS MEASURED TO SIZE 44   Trial reductions were then performed starting with SIZE 4 F  44 H 1.5 NECK LENGTH. Leg length and stability were restored with THESE IMPLANTS . We confirmed knee flexion past 90. ROM: 5 hyperextension with 50 of external rotation. We had adequate shuck test. Sleeping position was stable. At 90 flexion the hip internally rotated 50 without dislocation.  The trial components were then removed, drill holes were placed in the trochanter and  A #5 sutures were passed through the drill holes; the stem was placed followed by the femoral head.  The hip was reduced. The ROM tests were repeated and were satisfactory.The # 5 sutures were used to repair the abductors with the leg slightly internally rotated.  The wound was irrigated and 30 cc of Marcaine with epinephrine was injected into the sub-gluteus  medius area. Fascia was closed with the leg abducted with #1 Bralon sutures; followed by subfascial injection of 30 cc of Marcaine with epinephrine.  Subcutaneous tissue was closed with 0 Monocryl Skin staples were used to reapproximate the skin edges and a sterile dressing was applied.   The patient was taken to the recovery room in stable condition.  Standard postop total hip arthroplasty protocol for  direct lateral approach.

## 2013-11-28 NOTE — Progress Notes (Signed)
[facility]                                                                              Patient Demographics  Erika Diaz, is a 69 y.o. female, DOB - 02/11/1946, JAS:[id number]  Admit date - 11/27/2013   Admitting Physician Oswald Hillock, MD  Outpatient Primary MD for the patient is Redge Gainer, MD  LOS - 1   Chief Complaint  Patient presents with  . Fall  . Hip Pain      HPI 68 year old female past medical history of hypertension and alcohol abuse was brought to emergency department after sustaining a fall on the right hip. Patient drinks alcohol has been drinking since yesterday afternoon. As per her nephew, patient was holding her nephew the son and was walking when she lost her balance and fell on her right hip. Patient complained of pain in the hip but denied injury to the head or chest. Patient was brought to the hospital and was found to have a right femoral neck fracture on x-ray.  Patient drinks alcohol almost everyday, she drinks a sixpack of beer daily. At the time of admission, pain had improved after the patient had received morphine. She denied chest pain, shortness of breath, nausea. She did vomit once since being in the hospital. Patient was found to also have hyponatremia with a sodium of 121 in the emergency department.  Assessment & Plan   Right femoral neck fracture -X-ray of the right hip: Displaced right femoral neck fracture -Orthopedics consulted and appreciated, surgery planned for today, 7/6. -Continue pain control -Will consult physical therapy as well as occupational therapy after patient's surgery -Patient does not have a history of coronary artery disease, given her alcohol abuse she is moderate risk for surgery.  Alcohol abuse -Continue CIWA protocol. -Alcohol level upon admission was 190. -Patient counseled on cessation.  Hyponatremia and hypochloremia -Improving -Likely secondary to dehydration versus possible beer per to mania. -Will  continue normal saline and continue to monitor her BMP.  Hypertension -Currently uncontrolled, may be secondary to pain as well as noncompliance. -Continue atenolol and enalapril. -Continue hydralazine as needed.  Normocytic Anemia -Patient's baseline appears to be approximately 10 -Patient received 2uPRBCs -Hb currently 13.1 -Iron 43, Ferritin 18 -Will start patient on iron supplementation after surgery  Code Status: Full  Family Communication: None at bedside  Disposition Plan: Admitted.    Time Spent in minutes   25 minutes  Procedures  None  Consults   Orthopedics, Dr. Aline Brochure  DVT Prophylaxis  Heparin  Lab Results  Component Value Date   PLT 218 11/28/2013    Medications  Scheduled Meds: . atenolol  50 mg Oral Daily  . enalapril  10 mg Oral Daily  . folic acid  1 mg Oral Daily  . LORazepam  0-4 mg Intravenous Q6H   Followed by  . [START ON 11/29/2013] LORazepam  0-4 mg Intravenous Q12H  . multivitamin with minerals  1 tablet Oral Daily  . thiamine  100 mg Oral Daily   Or  . thiamine  100 mg Intravenous Daily   Continuous Infusions:  PRN Meds:.hydrALAZINE, hydrALAZINE, HYDROcodone-acetaminophen, LORazepam, LORazepam, morphine injection, ondansetron (ZOFRAN) IV  Antibiotics    Anti-infectives   None      Subjective:   Erika Diaz seen and examined today.  Patient continues to complain of right hip pain. Denies any chest pain, shortness of breath, abdominal pain at this time. Patient or complains of vomiting or nausea.  Objective:   Filed Vitals:   11/28/13 0150 11/28/13 0238 11/28/13 0519 11/28/13 0854  BP: 193/87 188/76 189/98   Pulse: 72 73 81   Temp: 99.3 F (37.4 C) 99.3 F (37.4 C) 98.6 F (37 C)   TempSrc: Oral Oral Oral   Resp: 18 20 20 18   Height:      Weight:      SpO2: 94% 96% 95% 97%    Wt Readings from Last 3 Encounters:  11/27/13 65.6 kg (144 lb 10 oz)  11/27/13 65.6 kg (144 lb 10 oz)  10/11/13 64.501 kg (142 lb 3.2 oz)       Intake/Output Summary (Last 24 hours) at 11/28/13 1053 Last data filed at 11/28/13 0550  Gross per 24 hour  Intake 3252.5 ml  Output   2500 ml  Net  752.5 ml    Exam  General: Well developed, well nourished, NAD, appears stated age  HEENT: NCAT, mucous membranes moist.   Neck: Supple, no JVD, no masses  Cardiovascular: S1 S2 auscultated, no rubs, murmurs or gallops. Regular rate and rhythm.  Respiratory: Clear to auscultation bilaterally with equal chest rise  Abdomen: Soft, nontender, nondistended, + bowel sounds  Extremities: warm dry without cyanosis clubbing or edema.  Pain to palpation of the right hip,  limited ROM due to pain, RLE externally rotated..   Neuro: AAOx3, no focal deficits  Skin: Without rashes exudates or nodules  Psych: Normal affect and demeanor with intact judgement and insight  Data Review   Micro Results Recent Results (from the past 240 hour(s))  SURGICAL PCR SCREEN     Status: None   Collection Time    11/27/13  3:45 PM      Result Value Ref Range Status   MRSA, PCR NEGATIVE  NEGATIVE Final   Staphylococcus aureus NEGATIVE  NEGATIVE Final   Comment:            The Xpert SA Assay (FDA     approved for NASAL specimens     in patients over 69 years of age),     is one component of     a comprehensive surveillance     program.  Test performance has     been validated by [employer] for patients greater     than or equal to 44 year old.     It is not intended     to diagnose infection nor to     guide or monitor treatment.    Radiology Reports Dg Chest 1 View  11/27/2013   CLINICAL DATA:  Fall with hip pain  EXAM: CHEST - 1 VIEW  COMPARISON:  None.  FINDINGS: Normal heart size and mediastinal contours. No acute infiltrate or edema. No effusion or pneumothorax. No acute osseous findings.  IMPRESSION: No active disease.   Electronically Signed   By: Jorje Guild M.D.   On: 11/27/2013 01:39   Dg Hip Complete Right  11/27/2013    CLINICAL DATA:  Fall.  Hip pain.  EXAM: RIGHT HIP - COMPLETE 2+ VIEW  COMPARISON:  None.  FINDINGS: There is an acute proximal right femur fracture with varus angulation. The fracture is  best characterized as a transcervical or basicervical femoral neck fracture. No evidence of pelvic ring disruption. The femoral heads are located.  IMPRESSION: Displaced right femoral neck fracture.   Electronically Signed   By: Jorje Guild M.D.   On: 11/27/2013 01:39   Ct Cervical Spine Wo Contrast  11/27/2013   CLINICAL DATA:  Fall down steps.  Bilateral neck pain.  EXAM: CT CERVICAL SPINE WITHOUT CONTRAST  TECHNIQUE: Multidetector CT imaging of the cervical spine was performed without intravenous contrast. Multiplanar CT image reconstructions were also generated.  COMPARISON:  None.  FINDINGS: Degenerative disc changes in the lower cervical spine. Multilevel bilateral degenerative facet disease. No fracture or subluxation. No epidural or paraspinal hematoma.  IMPRESSION: No acute bony abnormality.   Electronically Signed   By: Rolm Baptise M.D.   On: 11/27/2013 01:59    CBC  Recent Labs Lab 11/27/13 0139 11/27/13 0601 11/28/13 0650  WBC 13.1* 12.5* 12.1*  HGB 10.1* 9.9* 13.1  HCT 28.7* 28.2* 36.8  PLT 268 267 218  MCV 82.9 82.9 84.6  MCH 29.2 29.1 30.1  MCHC 35.2 35.1 35.6  RDW 14.6 14.4 14.4  LYMPHSABS 1.4  --   --   MONOABS 0.8  --   --   EOSABS 0.0  --   --   BASOSABS 0.0  --   --     Chemistries   Recent Labs Lab 11/27/13 0139 11/27/13 0601 11/28/13 0650  NA 121* 123* 127*  K 3.9 3.9 3.7  CL 84* 86* 91*  CO2 19 20 22   GLUCOSE 136* 118* 117*  BUN 6 5* 9  CREATININE 0.72 0.70 0.70  CALCIUM 8.9 8.6 8.9  AST 23  --   --   ALT 22  --   --   ALKPHOS 83  --   --   BILITOT 0.2*  --   --    ------------------------------------------------------------------------------------------------------------------ estimated creatinine clearance is 61.3 ml/min (by C-G formula based on Cr of  0.7). ------------------------------------------------------------------------------------------------------------------ No results found for this basename: HGBA1C,  in the last 72 hours ------------------------------------------------------------------------------------------------------------------ No results found for this basename: CHOL, HDL, LDLCALC, TRIG, CHOLHDL, LDLDIRECT,  in the last 72 hours ------------------------------------------------------------------------------------------------------------------ No results found for this basename: TSH, T4TOTAL, FREET3, T3FREE, THYROIDAB,  in the last 72 hours ------------------------------------------------------------------------------------------------------------------  Recent Labs  11/27/13 1052  VITAMINB12 360  FOLATE 18.3  FERRITIN 18  TIBC 312  IRON 43  RETICCTPCT 1.2    Coagulation profile  Recent Labs Lab 11/28/13 0858  INR 1.05    No results found for this basename: DDIMER,  in the last 72 hours  Cardiac Enzymes No results found for this basename: CK, CKMB, TROPONINI, MYOGLOBIN,  in the last 168 hours ------------------------------------------------------------------------------------------------------------------ No components found with this basename: POCBNP,     Ramsie Ostrander D.O. on 11/28/2013 at 10:53 AM  Between 7am to 7pm - Pager - [phone]  After 7pm go to [email or web address] - password [email or web address]  And look for the night coverage person covering for me after hours  [facility] Office  [phone]

## 2013-11-28 NOTE — Anesthesia Preprocedure Evaluation (Addendum)
Anesthesia Evaluation  Patient identified by MRN, date of birth, ID band Patient awake    Reviewed: Allergy & Precautions, H&P , NPO status , Patient's Chart, lab work & pertinent test results, reviewed documented beta blocker date and time   Airway Mallampati: III TM Distance: >3 FB Neck ROM: Full    Dental  (+) Partial Upper, Poor Dentition, Dental Advisory Given   Pulmonary former smoker,  breath sounds clear to auscultation        Cardiovascular hypertension, Pt. on medications and Pt. on home beta blockers Rhythm:Regular Rate:Normal     Neuro/Psych    GI/Hepatic negative GI ROS, (+)     substance abuse (acute intoxication yesterday)  alcohol use,   Endo/Other    Renal/GU      Musculoskeletal   Abdominal   Peds  Hematology  (+) anemia ,   Anesthesia Other Findings   Reproductive/Obstetrics                         Anesthesia Physical Anesthesia Plan  ASA: III  Anesthesia Plan: Spinal   Post-op Pain Management:    Induction:   Airway Management Planned: Simple Face Mask  Additional Equipment:   Intra-op Plan:   Post-operative Plan:   Informed Consent: I have reviewed the patients History and Physical, chart, labs and discussed the procedure including the risks, benefits and alternatives for the proposed anesthesia with the patient or authorized representative who has indicated his/her understanding and acceptance.     Plan Discussed with:   Anesthesia Plan Comments:         Anesthesia Quick Evaluation

## 2013-11-28 NOTE — Transfer of Care (Signed)
Immediate Anesthesia Transfer of Care Note  Patient: Erika Diaz  Procedure(s) Performed: Procedure(s): PARTIAL RIGHT HIP REPLACEMENT (BIPOLAR) (Right)  Patient Location: PACU  Anesthesia Type:Spinal  Level of Consciousness: awake and patient cooperative  Airway & Oxygen Therapy: Patient Spontanous Breathing and Patient connected to nasal cannula oxygen  Post-op Assessment: Report given to PACU RN and Post -op Vital signs reviewed and stable T 10  Post vital signs: Reviewed and stable  Complications: No apparent anesthesia complications

## 2013-11-28 NOTE — Anesthesia Postprocedure Evaluation (Signed)
  Anesthesia Post-op Note  Patient: Erika Diaz  Procedure(s) Performed: Procedure(s): PARTIAL RIGHT HIP REPLACEMENT (BIPOLAR) (Right)  Patient Location: PACU  Anesthesia Type:Spinal  Level of Consciousness: awake and patient cooperative  Airway and Oxygen Therapy: Patient Spontanous Breathing and Patient connected to nasal cannula oxygen  Post-op Pain: none  Post-op Assessment: Post-op Vital signs reviewed, Patient's Cardiovascular Status Stable, PATIENT'S CARDIOVASCULAR STATUS UNSTABLE, Respiratory Function Stable and Pain level controlled  Post-op Vital Signs: Reviewed and stable  Last Vitals:  Filed Vitals:   11/28/13 1418  BP: 141/59  Pulse: 88  Temp: 36.5 C  Resp: 18    Complications: No apparent anesthesia complications

## 2013-11-28 NOTE — Brief Op Note (Signed)
11/27/2013 - 11/28/2013  2:16 PM  PATIENT:  Erika Diaz  68 y.o. female  PRE-OPERATIVE DIAGNOSIS:  closed right femoral neck fracture  POST-OPERATIVE DIAGNOSIS:  closed right femoral neck fracture  PROCEDURE:  Procedure(s): PARTIAL RIGHT HIP REPLACEMENT (BIPOLAR) (Right)  DEPUY HIP PROSTHESIS SIZE 4 FEMUR, SIZE 44 HEAD WITH 1.5 NECK LENGTH  DIRECT LATERAL APPROACH   SURGEON:  Surgeon(s) and Role:    * Carole Civil, MD - Primary  PHYSICIAN ASSISTANT:   ASSISTANTS: DEBBIE DALLAS    ANESTHESIA:   spinal  EBL:  Total I/O In: 1000 [I.V.:1000] Out: 600 [Urine:550; Blood:50]  BLOOD ADMINISTERED:none  DRAINS: none   LOCAL MEDICATIONS USED:  MARCAINE     SPECIMEN:  No Specimen  DISPOSITION OF SPECIMEN:  N/A  COUNTS:  YES  TOURNIQUET:  * No tourniquets in log *  DICTATION: .Dragon Dictation  PLAN OF CARE: Admit to inpatient   PATIENT DISPOSITION:  PACU - hemodynamically stable.   Delay start of Pharmacological VTE agent (>24hrs) due to surgical blood loss or risk of bleeding: yes  DETAILS OF PROCEDURE:  The patient was identified in the preop holding area and the surgical site was marked and countersigned by the surgeon, the chart was updated. The consent was signed.  The patient was taken to the operating room for spinal anesthetic and then placed in the lateral decubitus position with appropriate padding and axillary roll. 2g of ANCEF were given because of the patient's weight of < 80 KG   After sterile prep and drape the timeout was executed  A lateral incision was made centered over the RIGHT greater trochanter to perform a direct lateral approach to the hip. After dividing the subcutaneous tissue down to the fascia, the fascia was split in line with the skin incision. Electrocautery was used to obtain hemostasis.   After deep retractors were placed, the gluteus medius was defined and the anterior half was peeled from the group trochanter in continuity with  the vastus lateralis; the anterior branch to the femoral circumflex artery was cauterized. Subperiosteal dissection continued until the gluteus minimus along with the gluteus medius was retracted proximally.  2 Steinmann pins were placed in the pelvis to retract the Glutei. The hip was dislocated anteriorly, a provisional femoral neck cut was made using the cutting guide. The lesser trochanter was then identified with further soft tissue dissection and a second femoral neck cut was made using the same guide.   A box osteotome was used to lateralize entry point into the femoral canal, this was followed by a femoral canal finder and subsequent broaching up to a size 4 femur. THE HEAD WAS MEASURED TO SIZE 44   Trial reductions were then performed starting with SIZE 4 F  44 H 1.5 NECK LENGTH. Leg length and stability were restored with THESE IMPLANTS . We confirmed knee flexion past 90. ROM: 5 hyperextension with 50 of external rotation. We had adequate shuck test. Sleeping position was stable. At 90 flexion the hip internally rotated 50 without dislocation.  The trial components were then removed, drill holes were placed in the trochanter and  A #5 sutures were passed through the drill holes; the stem was placed followed by the femoral head.  The hip was reduced. The ROM tests were repeated and were satisfactory.The # 5 sutures were used to repair the abductors with the leg slightly internally rotated.  The wound was irrigated and 30 cc of Marcaine with epinephrine was injected into the sub-gluteus  medius area. Fascia was closed with the leg abducted with #1 Bralon sutures; followed by subfascial injection of 30 cc of Marcaine with epinephrine.  Subcutaneous tissue was closed with 0 Monocryl Skin staples were used to reapproximate the skin edges and a sterile dressing was applied.   The patient was taken to the recovery room in stable condition.  Standard postop total hip arthroplasty protocol for  direct lateral approach.

## 2013-11-28 NOTE — ED Provider Notes (Signed)
Medical screening examination/treatment/procedure(s) were performed by non-physician practitioner and as supervising physician I was immediately available for consultation/collaboration.   EKG Interpretation None        Julianne Rice, MD 11/28/13 [phone]

## 2013-11-29 ENCOUNTER — Encounter (HOSPITAL_COMMUNITY): Payer: Self-pay | Admitting: *Deleted

## 2013-11-29 DIAGNOSIS — E876 Hypokalemia: Secondary | ICD-10-CM

## 2013-11-29 LAB — BASIC METABOLIC PANEL
Anion gap: 13 (ref 5–15)
BUN: 7 mg/dL (ref 6–23)
CHLORIDE: 92 meq/L — AB (ref 96–112)
CO2: 23 mEq/L (ref 19–32)
Calcium: 8.3 mg/dL — ABNORMAL LOW (ref 8.4–10.5)
Creatinine, Ser: 0.67 mg/dL (ref 0.50–1.10)
GFR, EST NON AFRICAN AMERICAN: 88 mL/min — AB (ref >90)
Glucose, Bld: 118 mg/dL — ABNORMAL HIGH (ref 70–99)
POTASSIUM: 3.2 meq/L — AB (ref 3.7–5.3)
Sodium: 128 mEq/L — ABNORMAL LOW (ref 137–147)

## 2013-11-29 LAB — CBC
HCT: 33.1 % — ABNORMAL LOW (ref 36.0–46.0)
HEMOGLOBIN: 11.7 g/dL — AB (ref 12.0–15.0)
MCH: 29.9 pg (ref 26.0–34.0)
MCHC: 35.3 g/dL (ref 30.0–36.0)
MCV: 84.7 fL (ref 78.0–100.0)
Platelets: 189 10*3/uL (ref 150–400)
RBC: 3.91 MIL/uL (ref 3.87–5.11)
RDW: 14.6 % (ref 11.5–15.5)
WBC: 15.2 10*3/uL — ABNORMAL HIGH (ref 4.0–10.5)

## 2013-11-29 MED ORDER — FERROUS SULFATE 325 (65 FE) MG PO TABS
325.0000 mg | ORAL_TABLET | Freq: Two times a day (BID) | ORAL | Status: DC
  Administered 2013-11-29 – 2013-12-01 (×2): 325 mg via ORAL
  Filled 2013-11-29 (×3): qty 1

## 2013-11-29 MED ORDER — POTASSIUM CHLORIDE CRYS ER 20 MEQ PO TBCR
20.0000 meq | EXTENDED_RELEASE_TABLET | Freq: Three times a day (TID) | ORAL | Status: DC
  Administered 2013-11-29 – 2013-11-30 (×4): 20 meq via ORAL
  Filled 2013-11-29 (×5): qty 1

## 2013-11-29 NOTE — Anesthesia Postprocedure Evaluation (Signed)
  Anesthesia Post-op Note  Patient: Erika Diaz  Procedure(s) Performed: Procedure(s): PARTIAL RIGHT HIP REPLACEMENT (BIPOLAR) (Right)  Patient Location: room 323  Anesthesia Type:Spinal  Level of Consciousness: awake, alert  and patient cooperative  Airway and Oxygen Therapy: Patient Spontanous Breathing  Post-op Pain: 5 /10, moderate  Post-op Assessment: Post-op Vital signs reviewed, Patient's Cardiovascular Status Stable, Respiratory Function Stable, Patent Airway, No signs of Nausea or vomiting and Pain level controlled  Post-op Vital Signs: Reviewed and stable  Last Vitals:  Filed Vitals:   11/29/13 0954  BP: 113/50  Pulse: 76  Temp: 36.9 C  Resp: 20    Complications: No apparent anesthesia complications

## 2013-11-29 NOTE — Clinical Social Work Note (Signed)
Abelardo Diesel [employer] care manager, will review case and determine insurance auth for SNF.  Marland KitchenAcadia

## 2013-11-29 NOTE — Clinical Social Work Psychosocial (Signed)
Clinical Social Work Department BRIEF PSYCHOSOCIAL ASSESSMENT 11/29/2013  Patient:  Erika, Diaz     Account Number:  [id number]     Admit date:  11/27/2013  Clinical Social Worker:  Edwyna Shell, CLINICAL SOCIAL WORKER  Date/Time:  11/29/2013 10:30 AM  Referred by:  Physician  Date Referred:  11/29/2013 Referred for  SNF Placement   Other Referral:   Interview type:  Family Other interview type:   Also spoke w patient who deferred to husband, patient day one post op and in pain.    PSYCHOSOCIAL DATA Living Status:  HUSBAND Admitted from facility:   Level of care:   Primary support name:  Santita Hunsberger Primary support relationship to patient:  SPOUSE Degree of support available:   Supportive spouse who says he is home "most of the time." Son lives across the street.    CURRENT CONCERNS Current Concerns  Post-Acute Placement   Other Concerns:    SOCIAL WORK ASSESSMENT / PLAN CSW met w patient at bedside, patient day 1 post surgery and in pain.  Did not want to participate actively, asked that CSW speak w husband in room.  Husband confirmed that wife will have care at home as he is home "most of the time."  Husband had hip replacement surgery 3 years ago and is familiar with hip rehab at home.  Says it worked well for him, had home PT 3x/week through [facility]  Says walker was able to work prevent falls for him.  Says that son lives across street and can assist if needed, daughter is respiratory therapist and familiar with medical issues. Says home is one level for living so patient will not have to negotiate stairs.  Confident that family will be able to meet patient's needs while rehabbing.    Husband says family's first choice is for patient to rehab at home, but agreed to back up SNF placement if needed. Asked for [facility] or [facility] as these SNFs are close to family home.  CSW will initiate bed search process and keep family and patient informed.  Patient is Erika Diaz.   Assessment/plan status:  Psychosocial Support/Ongoing Assessment of Needs Other assessment/ plan:   Information/referral to community resources:   SNF list    PATIENT'S/FAMILY'S RESPONSE TO PLAN OF CARE: Husband confident family can meet patient needs at home, says this is what patient would want.  CSW cautioned husband re possibility of another fall in immediate post surgery rehab period, said both PT and OT have recommended short term SNF rehab.  Husband agrees to back up SNF search.        Edwyna Shell, LCSW Clinical Social Worker [phone])

## 2013-11-29 NOTE — Progress Notes (Signed)
[facility]                                                                              Patient Demographics  Erika Diaz, is a 68 y.o. female, DOB - 04/08/1946, MBT:[id number]  Admit date - 11/27/2013   Admitting Physician Oswald Hillock, MD  Outpatient Primary MD for the patient is Redge Gainer, MD  LOS - 2   Chief Complaint  Patient presents with  . Fall  . Hip Pain      HPI 68 year old female past medical history of hypertension and alcohol abuse was brought to emergency department after sustaining a fall on the right hip. Patient drinks alcohol has been drinking since yesterday afternoon. As per her nephew, patient was holding her nephew the son and was walking when she lost her balance and fell on her right hip. Patient complained of pain in the hip but denied injury to the head or chest. Patient was brought to the hospital and was found to have a right femoral neck fracture on x-ray.  Patient drinks alcohol almost everyday, she drinks a sixpack of beer daily. At the time of admission, pain had improved after the patient had received morphine. She denied chest pain, shortness of breath, nausea. She did vomit once since being in the hospital. Patient was found to also have hyponatremia with a sodium of 121 in the emergency department.  Interim history Patient underwent a partial right hip replacement with Dr. Aline Brochure on 11/28/2013. PT and OT have been consulted and recommended skilled nursing facility. Patient does have a history of alcohol abuse. She does not wish to go to rehabilitation or nursing facility, she wishes to go home with home health. Patient was advised against this by myself, as well as occupational and physical therapy, social work and case management. Patient also has hyponatremia which has been improving slowly.  Assessment & Plan   Right femoral neck fracture -X-ray of the right hip: Displaced right femoral neck fracture -Orthopedics consulted and  appreciated, s/p partial right hip replacement, postop day 1. -Continue pain control with robaxin, morphine PRN -Physical therapy and occupational therapy have been consulted and recommended skilled nursing facility  Alcohol abuse -Continue CIWA protocol. -Alcohol level upon admission was 190. -Patient counseled on cessation.  Hyponatremia and hypochloremia -Improving -Likely secondary to dehydration versus possible beer per to mania. -Will continue normal saline and continue to monitor her BMP.  Hypertension -Controlled -Was uncontrolled, may have been secondary to pain as well as noncompliance. -Continue atenolol and enalapril. -Continue hydralazine PO and IV as needed.  Normocytic Anemia -Patient's baseline appears to be approximately 10 -Patient received 2uPRBCs on 7/5 -Hb currently 11.7 -Iron 43, Ferritin 18 -Continue iron replacement  Leukocytosis -Likely secondary to recent surgery/reactive -Will continue to monitor CBC -Encourage incentive spirometry -Patient did receive cefazolin  Hypokalemia -Will replace and continue to monitor   Code Status: Full  Family Communication: None at bedside  Disposition Plan: Admitted.    Time Spent in minutes   25 minutes  Procedures  None  Consults   Orthopedics, Dr. Aline Brochure  DVT Prophylaxis  Heparin  Lab Results  Component Value Date   PLT 189 11/29/2013  Medications  Scheduled Meds: . atenolol  50 mg Oral Daily  . docusate sodium  100 mg Oral BID  . enalapril  10 mg Oral Daily  . enoxaparin (LOVENOX) injection  30 mg Subcutaneous Q24H  . folic acid  1 mg Oral Daily  . LORazepam  0-4 mg Intravenous Q12H  . multivitamin with minerals  1 tablet Oral Daily  . potassium chloride  20 mEq Oral TID  . senna  1 tablet Oral BID  . thiamine  100 mg Oral Daily   Or  . thiamine  100 mg Intravenous Daily   Continuous Infusions: . sodium chloride 100 mL/hr at 11/29/13 0852   PRN Meds:.acetaminophen,  acetaminophen, alum & mag hydroxide-simeth, bisacodyl, hydrALAZINE, hydrALAZINE, HYDROcodone-acetaminophen, LORazepam, LORazepam, menthol-cetylpyridinium, methocarbamol (ROBAXIN) IV, methocarbamol, metoCLOPramide (REGLAN) injection, metoCLOPramide, morphine injection, ondansetron (ZOFRAN) IV, ondansetron (ZOFRAN) IV, ondansetron, phenol, polyethylene glycol  Antibiotics    Anti-infectives   Start     Dose/Rate Route Frequency Ordered Stop   11/28/13 1700  ceFAZolin (ANCEF) IVPB 2 g/50 mL premix     2 g 100 mL/hr over 30 Minutes Intravenous Every 6 hours 11/28/13 1654 11/29/13 0455   11/28/13 1129  ceFAZolin (ANCEF) IVPB 2 g/50 mL premix     2 g 100 mL/hr over 30 Minutes Intravenous On call to O.R. 11/28/13 1129 11/28/13 1236      Subjective:   Erika Diaz seen and examined today.  Patient states she's feeling better this morning. She is very hesitant about going to skilled nursing facility for rehabilitation. Patient denies any chest pain, shortness of breath, abdominal pain, nausea, vomiting at this time Patient continues to complain of right hip pain. Denies any chest pain, shortness of breath, abdominal pain at this time.   Objective:   Filed Vitals:   11/29/13 0102 11/29/13 0520 11/29/13 0800 11/29/13 0954  BP: 159/67 192/91  113/50  Pulse: 69 73  76  Temp: 99.5 F (37.5 C) 99.4 F (37.4 C)  98.5 F (36.9 C)  TempSrc:  Oral  Oral  Resp:  20 20 20   Height:      Weight:  72 kg (158 lb 11.7 oz)    SpO2: 95% 95% 5% 94%    Wt Readings from Last 3 Encounters:  11/29/13 72 kg (158 lb 11.7 oz)  11/29/13 72 kg (158 lb 11.7 oz)  10/11/13 64.501 kg (142 lb 3.2 oz)     Intake/Output Summary (Last 24 hours) at 11/29/13 1129 Last data filed at 11/29/13 0520  Gross per 24 hour  Intake   1245 ml  Output   1980 ml  Net   -735 ml    Exam  General: Well developed, well nourished, NAD, appears stated age  HEENT: NCAT, mucous membranes moist.   Neck: Supple, no JVD, no  masses  Cardiovascular: S1 S2 auscultated, no rubs, murmurs or gallops. Regular rate and rhythm.  Respiratory: Clear to auscultation bilaterally with equal chest rise  Abdomen: Soft, nontender, nondistended, + bowel sounds  Extremities: warm dry without cyanosis clubbing or edema.   Neuro: AAOx3, no focal deficits  Skin: Without rashes exudates or nodules  Psych: Normal affect and demeanor with intact judgement and insight  Data Review   Micro Results Recent Results (from the past 240 hour(s))  SURGICAL PCR SCREEN     Status: None   Collection Time    11/27/13  3:45 PM      Result Value Ref Range Status   MRSA, PCR NEGATIVE  NEGATIVE  Final   Staphylococcus aureus NEGATIVE  NEGATIVE Final   Comment:            The Xpert SA Assay (FDA     approved for NASAL specimens     in patients over 62 years of age),     is one component of     a comprehensive surveillance     program.  Test performance has     been validated by [employer] for patients greater     than or equal to 41 year old.     It is not intended     to diagnose infection nor to     guide or monitor treatment.    Radiology Reports Dg Chest 1 View  11/27/2013   CLINICAL DATA:  Fall with hip pain  EXAM: CHEST - 1 VIEW  COMPARISON:  None.  FINDINGS: Normal heart size and mediastinal contours. No acute infiltrate or edema. No effusion or pneumothorax. No acute osseous findings.  IMPRESSION: No active disease.   Electronically Signed   By: Jorje Guild M.D.   On: 11/27/2013 01:39   Dg Hip Complete Right  11/27/2013   CLINICAL DATA:  Fall.  Hip pain.  EXAM: RIGHT HIP - COMPLETE 2+ VIEW  COMPARISON:  None.  FINDINGS: There is an acute proximal right femur fracture with varus angulation. The fracture is best characterized as a transcervical or basicervical femoral neck fracture. No evidence of pelvic ring disruption. The femoral heads are located.  IMPRESSION: Displaced right femoral neck fracture.   Electronically  Signed   By: Jorje Guild M.D.   On: 11/27/2013 01:39   Ct Cervical Spine Wo Contrast  11/27/2013   CLINICAL DATA:  Fall down steps.  Bilateral neck pain.  EXAM: CT CERVICAL SPINE WITHOUT CONTRAST  TECHNIQUE: Multidetector CT imaging of the cervical spine was performed without intravenous contrast. Multiplanar CT image reconstructions were also generated.  COMPARISON:  None.  FINDINGS: Degenerative disc changes in the lower cervical spine. Multilevel bilateral degenerative facet disease. No fracture or subluxation. No epidural or paraspinal hematoma.  IMPRESSION: No acute bony abnormality.   Electronically Signed   By: Rolm Baptise M.D.   On: 11/27/2013 01:59    CBC  Recent Labs Lab 11/27/13 0139 11/27/13 0601 11/28/13 0650 11/29/13 0600  WBC 13.1* 12.5* 12.1* 15.2*  HGB 10.1* 9.9* 13.1 11.7*  HCT 28.7* 28.2* 36.8 33.1*  PLT 268 267 218 189  MCV 82.9 82.9 84.6 84.7  MCH 29.2 29.1 30.1 29.9  MCHC 35.2 35.1 35.6 35.3  RDW 14.6 14.4 14.4 14.6  LYMPHSABS 1.4  --   --   --   MONOABS 0.8  --   --   --   EOSABS 0.0  --   --   --   BASOSABS 0.0  --   --   --     Chemistries   Recent Labs Lab 11/27/13 0139 11/27/13 0601 11/28/13 0650 11/29/13 0600  NA 121* 123* 127* 128*  K 3.9 3.9 3.7 3.2*  CL 84* 86* 91* 92*  CO2 19 20 22 23   GLUCOSE 136* 118* 117* 118*  BUN 6 5* 9 7  CREATININE 0.72 0.70 0.70 0.67  CALCIUM 8.9 8.6 8.9 8.3*  AST 23  --   --   --   ALT 22  --   --   --   ALKPHOS 83  --   --   --   BILITOT 0.2*  --   --   --    ------------------------------------------------------------------------------------------------------------------  estimated creatinine clearance is 64 ml/min (by C-G formula based on Cr of 0.67). ------------------------------------------------------------------------------------------------------------------ No results found for this basename: HGBA1C,  in the last 72  hours ------------------------------------------------------------------------------------------------------------------ No results found for this basename: CHOL, HDL, LDLCALC, TRIG, CHOLHDL, LDLDIRECT,  in the last 72 hours ------------------------------------------------------------------------------------------------------------------ No results found for this basename: TSH, T4TOTAL, FREET3, T3FREE, THYROIDAB,  in the last 72 hours ------------------------------------------------------------------------------------------------------------------  Recent Labs  11/27/13 1052  VITAMINB12 360  FOLATE 18.3  FERRITIN 18  TIBC 312  IRON 43  RETICCTPCT 1.2    Coagulation profile  Recent Labs Lab 11/28/13 0858  INR 1.05    No results found for this basename: DDIMER,  in the last 72 hours  Cardiac Enzymes No results found for this basename: CK, CKMB, TROPONINI, MYOGLOBIN,  in the last 168 hours ------------------------------------------------------------------------------------------------------------------ No components found with this basename: POCBNP,     Erika Diaz D.O. on 11/29/2013 at 11:29 AM  Between 7am to 7pm - Pager - [phone]  After 7pm go to [email or web address] - password [email or web address]  And look for the night coverage person covering for me after hours  [facility] Office  [phone]

## 2013-11-29 NOTE — Progress Notes (Signed)
Postop day 1 status post bipolar hip replacement  Patient is comfortable  Hemoglobin is 11  Potassium is a little low and potassium replacement  The patient should be out of bed today weightbearing as tolerated. Foley can be removed.  Start discharge planning with plan discharge to home with family support

## 2013-11-29 NOTE — Evaluation (Signed)
Physical Therapy Evaluation Patient Details Name: Erika Diaz MRN: [id number] DOB: 1945-07-13 Today's Date: 11/29/2013   History of Present Illness  68 year old female who has a past medical history of Hypertension. today was brought to the ED after patient sustained a fall on the right hip. Patient drinks alcohol and has been drinking since yesterday afternoon. As per patient's nephew, patient was holding the nephew son and walking when she lost balance and fell on the right hip. She complained of pain in the hip but denied injury to the head or chest. Patient was brought to the hospital which was found to have right femoral neck fracture on the x-ray.  Patient drinks alcohol almost everyday, and she drinks about 6 packs of beer.  Pt underwent partial Rt hip replacement on 11/28/13 due to closed Rt femoral neck fracture; orders for WBAT with hip precautions.     Clinical Impression  Pt is a 68 year old female who presents to physical therapy after undergoing Rt partial hip replacement on 11/28/13 secondary to Rt femoral neck fracture. MD orders for WBAT.  Pt lives in a two story home with her husband and son, with 6-7 steps to enter with (B) handrails at both.  Prior to surgery, pt was (I) with all functional mobility skills.  During evaluation, the pt was mod/max assist for bed mobility skills and mod assist, use of RW, and bed elevated for sit <-> stand transfer.  Pt required multiple VC throughout treatment to follow hip precautions, and complete transfers.  Pt would start transfer then stop halfway through secondary to pain; educated pt on importance of rest breaks but continuing movement to avoid starting transfer over from the beginning. Recommend continued PT while in the hospital to address strengthening, activity tolerance, pain management, precautions to improve functional mobility skills with transition to SNF after discharge.  Will defer to SNF for DME recommendations.     Follow Up  Recommendations SNF    Equipment Recommendations   (Will defer to SNF for DME equiptment)       Precautions / Restrictions Precautions Precautions: Fall;Posterior Hip Precaution Comments: Hip abduction pillow.   Restrictions Weight Bearing Restrictions: Yes RLE Weight Bearing: Weight bearing as tolerated      Mobility  Bed Mobility Overal bed mobility: Needs Assistance Bed Mobility: Supine to Sit;Sit to Supine     Supine to sit: Max assist;HOB elevated (for trunk/LE) Sit to supine: Mod assist (for LE)   General bed mobility comments: Pt required multiple attempts to complete transfers secondary to pain with movement.    Transfers Overall transfer level: Needs assistance Equipment used: Rolling walker (2 wheeled) Transfers: Sit to/from Stand Sit to Stand: From elevated surface;Mod assist            Ambulation/Gait             General Gait Details: Attempted, however pt unable to clear Rt or Lt foot from floor due to pain     Balance Overall balance assessment: Needs assistance Sitting-balance support: Feet supported;Bilateral upper extremity supported Sitting balance-Leahy Scale: Fair     Standing balance support: Bilateral upper extremity supported;During functional activity (On RW with mod assist ) Standing balance-Leahy Scale: Poor                               Pertinent Vitals/Pain Pt reports pain score 6/10 prior to evaluation.  Pt reported pain throughout evaluation during functional mobility skills  which limited active participation.  RN made aware.     Home Living Family/patient expects to be discharged to:: Skilled nursing facility Living Arrangements: Spouse/significant other;Children (Lives with husband, son)               Additional Comments: Pt lives in a two story home with 6-7 steps to enter; (B) handrails at both sets of steps.  Pt reports her bedroom and bathroom are on the second floor.  She lives with her husband  and son who are available to help 24/7 if needed.   Possible DME: shower seat, raised toilet seta, RW, quad cane, tub shower (pt reports her husband had a hip replacement ~1 year ago and she thinks she has this equipment).     Prior Function Level of Independence: Independent                  Extremity/Trunk Assessment               Lower Extremity Assessment: RLE deficits/detail         Communication   Communication: No difficulties  Cognition Arousal/Alertness: Lethargic Behavior During Therapy: WFL for tasks assessed/performed Overall Cognitive Status: Difficult to assess (Unable to follow/repeat hip precautions back to PT after visual/verbal demonstrations of precautions)                      General Comments      Exercises Total Joint Exercises Ankle Circles/Pumps: AROM;Both;20 reps;Supine Quad Sets: Strengthening;Both;10 reps;Supine      Assessment/Plan    PT Assessment Patient needs continued PT services  PT Diagnosis Difficulty walking;Generalized weakness;Acute pain   PT Problem List Decreased strength;Decreased cognition;Decreased range of motion;Decreased activity tolerance;Decreased safety awareness;Decreased balance;Decreased mobility;Pain  PT Treatment Interventions DME instruction;Balance training;Gait training;Neuromuscular re-education;Stair training;Functional mobility training;Therapeutic activities;Therapeutic exercise;Patient/family education   PT Goals (Current goals can be found in the Care Plan section) Acute Rehab PT Goals Patient Stated Goal: none stated PT Goal Formulation: With patient Time For Goal Achievement: 12/13/13 Potential to Achieve Goals: Good    Frequency 7X/week   Barriers to discharge Decreased caregiver support;Inaccessible home environment Stairs in home to enter and to bedroom       End of Session Equipment Utilized During Treatment: Gait belt Activity Tolerance: Patient limited by pain Patient left:  in bed;with call bell/phone within reach;with bed alarm set Nurse Communication: Patient requests pain meds         Time: 3953-2023 PT Time Calculation (min): 28 min   Charges:   PT Evaluation $Initial PT Evaluation Tier I: 1 Procedure          Kenneshia Rehm 11/29/2013, 9:00 AM

## 2013-11-29 NOTE — Evaluation (Signed)
Occupational Therapy Evaluation Patient Details Name: Erika Diaz MRN: [id number] DOB: 04-01-1946 Today's Date: 11/29/2013    History of Present Illness 68 year old female who has a past medical history of Hypertension. today was brought to the ED after patient sustained a fall on the right hip. Patient drinks alcohol and has been drinking since yesterday afternoon. As per patient's nephew, patient was holding the nephew son and walking when she lost balance and fell on the right hip. She complained of pain in the hip but denied injury to the head or chest. Patient was brought to the hospital which was found to have right femoral neck fracture on the x-ray.  Patient drinks alcohol almost everyday, and she drinks about 6 packs of beer.  Pt underwent partial Rt hip replacement on 7/76/15 due to closed Rt femoral neck fracture; orders for WBAT with hip precautions.      Clinical Impression   Pt presenting to acute OT with above situation.  Sh has generalized weakness in BUE and presented with some confusion/decreased recall during evaluation - pt became agitated with consistent reminders of hip precautions.  Educated pt on AE for lower body ADLs - pt verbalized some familiarity due to husband's surgery approx 1 year ago.  Pt is currently at max assist level with all lower extremity ADLs, and erquired max assist to adjust self in bed.  Pt is determined that she wants to go home, but encouraged pt consider SNF rehab.  Recommend SNF OT at this time.    Follow Up Recommendations  SNF    Equipment Recommendations  Other (comment) (Defer to SNF at d/c)    Recommendations for Other Services       Precautions / Restrictions Precautions Precautions: Fall;Posterior Hip Precaution Comments: Hip abduction pillow.   Restrictions Weight Bearing Restrictions: Yes RLE Weight Bearing: Weight bearing as tolerated      Mobility Bed Mobility   General bed mobility comments: Pt max assist to decrease  lateral lean while in bed  Transfers              Balance                              ADL Overall ADL's : Needs assistance/impaired Eating/Feeding: Set up;Bed level   Grooming: Set up;Bed level       Lower Body Bathing: Maximal assistance       Lower Body Dressing: Maximal assistance                       Vision                     Perception     Praxis      Pertinent Vitals/Pain Pt in 8/10 pain - RN just gave pain meds     Hand Dominance Right   Extremity/Trunk Assessment Upper Extremity Assessment Upper Extremity Assessment: Generalized weakness   Lower Extremity Assessment Lower Extremity Assessment: Defer to PT evaluation RLE: Unable to fully assess due to pain       Communication Communication Communication: No difficulties   Cognition Arousal/Alertness: Lethargic Behavior During Therapy: WFL for tasks assessed/performed Overall Cognitive Status: Difficult to assess (Pt unable to recall that she has hip precautions, and unable to say what they are.)       Memory: Decreased recall of precautions             General  Comments       Exercises       Shoulder Instructions      Home Living Family/patient expects to be discharged to:: Skilled nursing facility Living Arrangements: Spouse/significant other;Children (Husband and son) Available Help at Discharge: Family;Available 24 hours/day Type of Home: House Home Access: Stairs to enter [employer] of Steps: 6-7 Entrance Stairs-Rails: Can reach both Home Layout: Two level;Bed/bath upstairs                   Additional Comments: Possible DME: shower seat, raised toilet seta, RW, quad cane, tub shower (pt reports her husband had a hip replacement ~1 year ago and she thinks she has this equipment).       Prior Functioning/Environment Level of Independence: Independent             OT Diagnosis: Generalized weakness;Other (comment)  (Decreased ADL status)   OT Problem List: Decreased strength;Decreased range of motion;Decreased activity tolerance;Pain;Decreased safety awareness;Decreased knowledge of precautions;Decreased knowledge of use of DME or AE   OT Treatment/Interventions: Self-care/ADL training;Therapeutic exercise;Energy conservation;DME and/or AE instruction;Patient/family education;Therapeutic activities;Balance training    OT Goals(Current goals can be found in the care plan section) Acute Rehab OT Goals Patient Stated Goal: none stated OT Goal Formulation: With patient Time For Goal Achievement: 12/13/13 Potential to Achieve Goals: Fair ADL Goals Pt Will Perform Lower Body Dressing: with min assist Pt Will Transfer to Toilet: with min assist Pt Will Perform Toileting - Clothing Manipulation and hygiene: with min guard assist Pt/caregiver will Perform Home Exercise Program: Increased strength;Both right and left upper extremity  OT Frequency: Min 3X/week   Barriers to D/C: Inaccessible home environment (Pt wants to go home)          Co-evaluation              End of Session    Activity Tolerance: Patient tolerated treatment well;Patient limited by lethargy Patient left: in bed;with family/visitor present;with call bell/phone within reach;with bed alarm set   Time: 8309-4076 OT Time Calculation (min): 27 min Charges:  OT General Charges $OT Visit: 1 Procedure OT Evaluation $Initial OT Evaluation Tier I: 1 Procedure OT Treatments $Self Care/Home Management : 8-22 mins G-Codes:     Bea Graff, MS, OTR/L [phone]  11/29/2013, 9:55 AM

## 2013-11-29 NOTE — Addendum Note (Signed)
Addendum created 11/29/13 1006 by Charmaine Downs, CRNA   Modules edited: Notes Section   Notes Section:  File: [id number]

## 2013-11-29 NOTE — Clinical Social Work Placement (Signed)
    Clinical Social Work Department CLINICAL SOCIAL WORK PLACEMENT NOTE 11/30/2013  Patient:  Erika Diaz, Erika Diaz  Account Number:  [id number] Admit date:  11/27/2013  Clinical Social Worker:  Edwyna Shell, CLINICAL SOCIAL WORKER  Date/time:  11/29/2013 10:30 AM  Clinical Social Work is seeking post-discharge placement for this patient at the following level of care:   [place]   (*CSW will update this form in Epic as items are completed)   11/29/2013  Patient/family provided with [place] Department of Clinical Social Work's list of facilities offering this level of care within the geographic area requested by the patient (or if unable, by the patient's family).  11/29/2013  Patient/family informed of their freedom to choose among providers that offer the needed level of care, that participate in Medicare, Medicaid or managed care program needed by the patient, have an available bed and are willing to accept the patient.  11/29/2013  Patient/family informed of [facility]' ownership interest in [facility], as well as of the fact that they are under no obligation to receive care at this facility.  PASARR submitted to EDS on 11/29/2013 PASARR number received on 11/29/2013  FL2 transmitted to all facilities in geographic area requested by pt/family on  11/29/2013 FL2 transmitted to all facilities within larger geographic area on   Patient informed that his/her managed care company has contracts with or will negotiate with  certain facilities, including the following:     Patient/family informed of bed offers received:  11/30/2013 Patient chooses bed at  Physician recommends and patient chooses bed at    Patient to be transferred to  on   Patient to be transferred to facility by  Patient and family notified of transfer on  Name of family member notified:    The following physician request were entered in Epic:   Additional Comments:  Edwyna Shell,  LCSW Clinical Social Worker [phone])

## 2013-11-30 LAB — CBC
HEMATOCRIT: 34.6 % — AB (ref 36.0–46.0)
Hemoglobin: 12.3 g/dL (ref 12.0–15.0)
MCH: 30.4 pg (ref 26.0–34.0)
MCHC: 35.5 g/dL (ref 30.0–36.0)
MCV: 85.4 fL (ref 78.0–100.0)
PLATELETS: 186 10*3/uL (ref 150–400)
RBC: 4.05 MIL/uL (ref 3.87–5.11)
RDW: 14.7 % (ref 11.5–15.5)
WBC: 14.1 10*3/uL — ABNORMAL HIGH (ref 4.0–10.5)

## 2013-11-30 LAB — BASIC METABOLIC PANEL
Anion gap: 14 (ref 5–15)
BUN: 6 mg/dL (ref 6–23)
CALCIUM: 8.7 mg/dL (ref 8.4–10.5)
CO2: 24 mEq/L (ref 19–32)
CREATININE: 0.6 mg/dL (ref 0.50–1.10)
Chloride: 92 mEq/L — ABNORMAL LOW (ref 96–112)
GFR calc Af Amer: >90 mL/min (ref >90)
GLUCOSE: 102 mg/dL — AB (ref 70–99)
Potassium: 3.2 mEq/L — ABNORMAL LOW (ref 3.7–5.3)
SODIUM: 130 meq/L — AB (ref 137–147)

## 2013-11-30 MED ORDER — POLYETHYLENE GLYCOL 3350 17 G PO PACK
17.0000 g | PACK | Freq: Two times a day (BID) | ORAL | Status: DC
  Administered 2013-11-30 (×2): 17 g via ORAL
  Filled 2013-11-30 (×2): qty 1

## 2013-11-30 MED ORDER — ONDANSETRON HCL 4 MG/2ML IJ SOLN
4.0000 mg | Freq: Once | INTRAMUSCULAR | Status: AC
  Administered 2013-11-30: 4 mg via INTRAVENOUS
  Filled 2013-11-30: qty 2

## 2013-11-30 MED ORDER — MORPHINE SULFATE 2 MG/ML IJ SOLN: 0.5000 mg | INTRAMUSCULAR | Status: DC | PRN

## 2013-11-30 MED ORDER — LORAZEPAM 2 MG/ML IJ SOLN
1.0000 mg | Freq: Four times a day (QID) | INTRAMUSCULAR | Status: DC | PRN
  Administered 2013-11-30 (×2): 1 mg via INTRAVENOUS
  Filled 2013-11-30 (×2): qty 1

## 2013-11-30 MED ORDER — LORAZEPAM 1 MG PO TABS: 1.0000 mg | ORAL_TABLET | Freq: Four times a day (QID) | ORAL | Status: DC | PRN

## 2013-11-30 MED ORDER — POTASSIUM CHLORIDE 10 MEQ/100ML IV SOLN
10.0000 meq | INTRAVENOUS | Status: AC
  Administered 2013-11-30 (×4): 10 meq via INTRAVENOUS
  Filled 2013-11-30 (×3): qty 100

## 2013-11-30 NOTE — Progress Notes (Signed)
Physical Therapy Treatment Patient Details Name: Erika Diaz MRN: [id number] DOB: 27-Jul-1945 Today's Date: 11/30/2013    History of Present Illness 68 year old female who has a past medical history of Hypertension. today was brought to the ED after patient sustained a fall on the right hip. Patient drinks alcohol and has been drinking since yesterday afternoon. As per patient's nephew, patient was holding the nephew son and walking when she lost balance and fell on the right hip. She complained of pain in the hip but denied injury to the head or chest. Patient was brought to the hospital which was found to have right femoral neck fracture on the x-ray.  Patient drinks alcohol almost everyday, and she drinks about 6 packs of beer.  Pt underwent partial Rt hip replacement on 7/76/15 due to closed Rt femoral neck fracture; orders for WBAT with hip precautions.       PT Comments    Co-treatment with OT today for safety of pt and therapist with functional mobility skills.  Pt reports "hurts a little" when in bed, however increasing complaints of pain were reported with functional mobility.  Complaints of nausea this morning, and pt received zofran from RN prior to treatment session.  Noted improved bed mobility skills, with min/mod assist as pt able to actively move the Rt LE today.  Pt did require max verbal cueing for technique to move Lt LE without assist of PT, and for handplacement to avoid breaking hip precautions.  Pt unable to verbalize hip precautions.  Once at EOB, pt reported she needed to use the bathroom, and stand pivot transfer complete to [facility]  Transfer x1 with physical assist from PT and x1 with supervision from OT for correct placement over commode as pt impulsive to sit secondary to pain before being over commmode.  Once transferred back to EOB, assist x1 (PT) to maintain position as pt leans to the Lt to unweight Rt hip (though pt unable to report if this was due to pain or LOB), and x1  (OT) to participate in ADL activity with OT.  Continuous cueing throughout treatment session to avoid leaning side to side and maintaining upright positioning, and to avoid activities to break hip precautions.  Pt reports increased complaints of nausea when sitting and requested transfer back to bed; unable to tolerate additional functional mobility skills.  Encouraged pt to eat her breakfast (as tray untouched) to decrease nausea symptoms from medications and increase healing/energy.  Recommend continued PT, with co-treatments as appropriate, to address strengthening, balance, and functional mobility skills.  Encouraged pt for SNF placement as pt needs 24/7 skilled care to address all needs (NSG, PT, OT) for improvement to PLOF.            Precautions / Restrictions Precautions Precautions: Fall;Posterior Hip Precaution Comments: Hip abduction pillow.   Restrictions Weight Bearing Restrictions: Yes RLE Weight Bearing: Weight bearing as tolerated    Mobility  Bed Mobility Overal bed mobility: Needs Assistance Bed Mobility: Supine to Sit;Sit to Supine     Supine to sit: Min assist;Mod assist Sit to supine: Min assist;Mod assist   General bed mobility comments: Noted decreased assist today with bed mobility, as pt able to activly participte in transfer with Rt and Lt LE  Transfers Overall transfer level: Needs assistance Equipment used: Rolling walker (2 wheeled) Transfers: Sit to/from [facility] Sit to Stand: Mod assist Stand pivot transfers: Mod assist       General transfer comment: Assist x1 for transfer (  PT), and x1 for safety (OT) to commode to ensure hip precautions followed and proper positioning over commode prior to sitting.    Ambulation/Gait             General Gait Details: Pt unable to attempt secondary to pain in hip with transfer to commode and decreased activity tolerance.        Balance Overall balance assessment: Needs  assistance Sitting-balance support: Single extremity supported;Feet supported Sitting balance-Leahy Scale: Fair Sitting balance - Comments: Assist x1 (PT) to maintain sitting at EOB as pt leans to Lt to unweight Rt hip, and x1 (OT) to participate in ADL activity  Postural control: Left lateral lean Standing balance support: Single extremity supported;During functional activity Standing balance-Leahy Scale: Poor                      Cognition Arousal/Alertness: Awake/alert Behavior During Therapy: WFL for tasks assessed/performed Overall Cognitive Status: Within Functional Limits for tasks assessed (Pt still with decreased recall of hip precautions)       Memory: Decreased recall of precautions              Exercises          Pertinent Vitals/Pain FACES "hurts a little"           PT Goals (current goals can now be found in the care plan section) Acute Rehab PT Goals Patient Stated Goal: to go home Progress towards PT goals: Progressing toward goals            Co-evaluation PT/OT/SLP Co-Evaluation/Treatment: Yes Reason for Co-Treatment: Complexity of the patient's impairments (multi-system involvement);For patient/therapist safety PT goals addressed during session: Mobility/safety with mobility;Balance;Proper use of DME OT goals addressed during session: ADL's and self-care;Strengthening/ROM;Proper use of Adaptive equipment and DME     End of Session Equipment Utilized During Treatment: Gait belt Activity Tolerance: Patient limited by pain;Patient limited by fatigue;Other (comment) (Limited by nausea) Patient left: in bed;with call bell/phone within reach;with bed alarm set     Time: 2703-5009 PT Time Calculation (min): 29 min  Charges:  $Therapeutic Activity: 8-22 mins                     Elmond Poehlman 11/30/2013, 10:47 AM

## 2013-11-30 NOTE — Progress Notes (Signed)
PROGRESS NOTE  Erika Diaz TKW:[id number] DOB: 10/14/1945 DOA: 11/27/2013 PCP: Redge Gainer, MD  Summary: 68 year old woman presented with history of fall onto right hip after drinking alcohol. Imaging revealed right hip fracture and laboratory evaluation revealed hyponatremia. Underwent partial right hip replacement 7/6. Physical and occupational therapy is recommended skilled nursing facility placement but patient refuses.  Assessment/Plan: 1. Right femoral neck fracture status post mechanical fall. Stable. 2. Hypoosmolar hyponatremia; hypochloremia. [place]ia. Slowly improving with IV fluids. Repeat basic metabolic. 3. Normocytic anemia. Suspect component of iron deficiency anemia. Oral iron on discharge. Repeat CBC today. 4. Nausea, vomiting. Exam benign. Possibly related to alcohol withdrawal. Follow clinically. 5. Suspected alcohol withdrawal, status post alcohol intoxication. Restart CIWA. 6. Hypertension. Stable. 7. Suspected alcohol dependence. 8. Possible dementia   Continue postoperative management per orthopedics. Continue therapy. SNF recommended the patient refusing initially but now accepting. Continue IV fluids,   Repeat basic metabolic panel, CBC.  Restart CIWA.  Bowel regimen  weightbearing as tolerated  Social work [place].  Code Status: full code DVT prophylaxis: Lovenox Family Communication: Discussed with husband at bedside Disposition Plan: Skilled nursing facility in the next 67 hours  Murray Hodgkins, MD  [facility]  Pager [phone] If 7PM-7AM, please contact night-coverage at [email or web address], password [facility] 11/30/2013, 11:47 AM  LOS: 3 days   Consultants:  Orthopedics  PT: SNF  OT: SNF  Procedures:  Transfusion 2 units packed red blood cells 7/5  7/6 PARTIAL RIGHT HIP REPLACEMENT (BIPOLAR) (Right)  HPI/Subjective: Anxious with nausea and some vomiting today per nursing. Nursing impression alcohol withdrawal.  Patient feels  better after Ativan. She has some nausea. No abdominal pain. No further vomiting. No other complaints except hip pain.  Objective: Filed Vitals:   11/30/13 0344 11/30/13 0651 11/30/13 0744 11/30/13 1020  BP: 191/77 138/56  177/88  Pulse: 78 98  77  Temp: 98.6 F (37 C)   99.2 F (37.3 C)  TempSrc: Oral   Oral  Resp: 20  18 18   Height:      Weight:      SpO2: 95%   97%    Intake/Output Summary (Last 24 hours) at 11/30/13 1147 Last data filed at 11/30/13 0700  Gross per 24 hour  Intake 3602.09 ml  Output    200 ml  Net 3402.09 ml     Filed Weights   11/27/13 0458 11/29/13 0520  Weight: 65.6 kg (144 lb 10 oz) 72 kg (158 lb 11.7 oz)    Exam:   Afebrile, vital signs stable. No hypoxia. Gen. Appears calm, nontoxic. Comfortable. Psych. Grossly normal mood and affect. Speech fluent and clear. Alert, oriented to self, location, month but needed prompting for year. Cardiovascular. Regular rate and rhythm. No murmur, rub or gallop. No lower extremity edema. Respiratory. Clear to auscultation bilaterally. No wheezes, rales or rhonchi. Normal respiratory effort. Abdomen soft, nontender, nondistended. No masses appreciated. No hernias seen. Musculoskeletal. Follows commands. Grossly nonfocal.  Data Reviewed: I/O: Adequate urine output. Multiple voids. No labs   Scheduled Meds: . atenolol  50 mg Oral Daily  . docusate sodium  100 mg Oral BID  . enalapril  10 mg Oral Daily  . enoxaparin (LOVENOX) injection  30 mg Subcutaneous Q24H  . ferrous sulfate  325 mg Oral BID WC  . folic acid  1 mg Oral Daily  . multivitamin with minerals  1 tablet Oral Daily  . potassium chloride  20 mEq Oral TID  . senna  1 tablet Oral BID  .  thiamine  100 mg Oral Daily   Or  . thiamine  100 mg Intravenous Daily   Continuous Infusions: . sodium chloride 100 mL/hr at 11/29/13 3794    Active Problems:   HTN (hypertension)   Closed right hip fracture   Femur fracture, right   Alcohol abuse    Hyponatremia   Hip fracture   Time spent 20 minutes

## 2013-11-30 NOTE — Progress Notes (Signed)
Occupational Therapy Treatment Patient Details Name: Erika Diaz MRN: [id number] DOB: 08-14-1945 Today's Date: 11/30/2013    History of present illness 68 year old female who has a past medical history of Hypertension. today was brought to the ED after patient sustained a fall on the right hip. Patient drinks alcohol and has been drinking since yesterday afternoon. As per patient's nephew, patient was holding the nephew son and walking when she lost balance and fell on the right hip. She complained of pain in the hip but denied injury to the head or chest. Patient was brought to the hospital which was found to have right femoral neck fracture on the x-ray.  Patient drinks alcohol almost everyday, and she drinks about 6 packs of beer.  Pt underwent partial Rt hip replacement on 7/76/15 due to closed Rt femoral neck fracture; orders for WBAT with hip precautions.      OT comments  Pt verbalized 'a little pain' this date.  Had improved tolerance of functional mobility this session, with mod assist to transfer to [facility] (+2 for safety).  Pt fatigued easily and nauseated - encouraged to eat (with set up).  Re-enforced importance of going to SNF at d/c rather than home - pt still verbalizing that she would like to d/c home.   Follow Up Recommendations  SNF    Equipment Recommendations       Recommendations for Other Services      Precautions / Restrictions Precautions Precautions: Fall;Posterior Hip Precaution Comments: Hip abduction pillow.   Restrictions Weight Bearing Restrictions: Yes RLE Weight Bearing: Weight bearing as tolerated       Mobility Bed Mobility Overal bed mobility: Needs Assistance Bed Mobility: Supine to Sit;Sit to Supine     Supine to sit: Mod assist Sit to supine: Mod assist      Transfers Overall transfer level: Needs assistance Equipment used: Rolling walker (2 wheeled) Transfers: Sit to/from [facility] Sit to Stand: Mod assist Stand  pivot transfers: Mod assist            Balance Overall balance assessment: Needs assistance Sitting-balance support: Feet supported;Single extremity supported Sitting balance-Leahy Scale: Fair   Postural control: Left lateral lean Standing balance support: Single extremity supported;During functional activity Standing balance-Leahy Scale: Poor                     ADL Overall ADL's : Needs assistance/impaired Eating/Feeding: Set up;Bed level Eating/Feeding Details (indicate cue type and reason): Encouragement to eat Grooming: Set up;Sitting;Wash/dry face;Min guard                   Toilet Transfer: BSC;Moderate assistance;Stand-pivot;+2 for safety/equipment;Cueing for safety;Adhering to hip precautions;RW   Toileting- Clothing Manipulation and Hygiene: Set up;+2 for safety/equipment;Cueing for safety;Adhering to hip precautions;Sit to/from stand       Functional mobility during ADLs: Rolling walker;Cueing for safety        Vision                     Perception     Praxis      Cognition   Behavior During Therapy: [facility] for tasks assessed/performed Overall Cognitive Status: Within Functional Limits for tasks assessed (Pt still with decreased recall of hip precautions)       Memory: Decreased recall of precautions               Extremity/Trunk Assessment  Exercises     Shoulder Instructions       General Comments      Pertinent Vitals/ Pain       "a little pain"  Home Living                                          Prior Functioning/Environment              Frequency Min 3X/week     Progress Toward Goals  OT Goals(current goals can now be found in the care plan section)  Progress towards OT goals: Progressing toward goals  Acute Rehab OT Goals Patient Stated Goal: to go home OT Goal Formulation: With patient Time For Goal Achievement: 12/13/13 Potential to Achieve Goals: [place] Discharge plan remains appropriate    Co-evaluation    PT/OT/SLP Co-Evaluation/Treatment: Yes Reason for Co-Treatment: For patient/therapist safety   OT goals addressed during session: ADL's and self-care;Strengthening/ROM;Proper use of Adaptive equipment and DME      End of Session Equipment Utilized During Treatment: Rolling walker   Activity Tolerance Patient tolerated treatment well   Patient Left in bed;with call bell/phone within reach;with bed alarm set   Nurse Communication          Time: 6979-4801 OT Time Calculation (min): 27 min  Charges: OT General Charges $OT Visit: 1 Procedure OT Treatments $Self Care/Home Management : 8-22 mins  Bea Graff, [place], OTR/L [phone]  11/30/2013, 10:07 AM

## 2013-11-30 NOTE — Progress Notes (Signed)
11/30/13 1838 Patient tolerated cream of tomato soup brought in by her son this afternoon. No further c/o nausea, no vomiting witnessed since zofran about 1430, see MAR. Pt appears comfortable at this time. Family present at bedside this afternoon. Text-paged MD to notify they requested update regarding her plan of care. MD aware patient unable to take po medications earlier today, did not attempt giving them later this afternoon in case of further nausea. Stated that was okay. Patient has denied pain today, some moaning with repositioning. Had been refusing pain medication when offered, stating "I'm okay, don't need it". Discussed this with son and daugther. Vicodin 1 tablet given this afternoon as ordered PRN pain when patient c/o right hip pain and agreed to try pain medication. Appeared comfortable on reassessment. Asleep at this time. Donavan Foil, RN

## 2013-11-30 NOTE — Clinical Social Work Note (Signed)
Patient and husband informed [facility] can offer bed, insurance has authorized.  [facility] is not in [employer].  Edwyna Shell, LCSW Clinical Social Worker [phone])

## 2013-11-30 NOTE — Clinical Social Work Note (Signed)
[employer] has authorized SNF at [facility] for patient if desired.  Edwyna Shell, LCSW Clinical Social Worker [phone])

## 2013-11-30 NOTE — Progress Notes (Signed)
11/30/13 0912 Patient c/o nausea, vomiting small amounts of clear, yellow emesis this am. Anxious, some tremors noted. No ativan ordered PRN, next scheduled dose due at 1700. CIWA score 10. Notified Dr. Sarajane Jews. Orders placed for ativan PRN for withdrawal symptoms per MD. Ativan 1 mg IV given as ordered, see MAR. Donavan Foil, RN

## 2013-11-30 NOTE — Progress Notes (Signed)
11/30/13 1156 Patient unable to tolerate po medications this morning d/t nausea, vomiting. Text-paged Dr. Sarajane Jews to notify. Donavan Foil, RN

## 2013-12-01 LAB — CBC
HCT: 32.3 % — ABNORMAL LOW (ref 36.0–46.0)
HEMOGLOBIN: 11.2 g/dL — AB (ref 12.0–15.0)
MCH: 30.1 pg (ref 26.0–34.0)
MCHC: 34.7 g/dL (ref 30.0–36.0)
MCV: 86.8 fL (ref 78.0–100.0)
Platelets: 199 10*3/uL (ref 150–400)
RBC: 3.72 MIL/uL — ABNORMAL LOW (ref 3.87–5.11)
RDW: 15 % (ref 11.5–15.5)
WBC: 9.5 10*3/uL (ref 4.0–10.5)

## 2013-12-01 LAB — TYPE AND SCREEN
ABO/RH(D): AB POS
Antibody Screen: NEGATIVE
UNIT DIVISION: 0
Unit division: 0
Unit division: 0
Unit division: 0

## 2013-12-01 LAB — BASIC METABOLIC PANEL
ANION GAP: 9 (ref 5–15)
BUN: 9 mg/dL (ref 6–23)
CALCIUM: 8.7 mg/dL (ref 8.4–10.5)
CO2: 25 mEq/L (ref 19–32)
Chloride: 99 mEq/L (ref 96–112)
Creatinine, Ser: 0.66 mg/dL (ref 0.50–1.10)
GFR calc non Af Amer: 89 mL/min — ABNORMAL LOW (ref >90)
Glucose, Bld: 83 mg/dL (ref 70–99)
Potassium: 4.1 mEq/L (ref 3.7–5.3)
Sodium: 133 mEq/L — ABNORMAL LOW (ref 137–147)

## 2013-12-01 MED ORDER — POLYETHYLENE GLYCOL 3350 17 G PO PACK: 17.0000 g | PACK | Freq: Two times a day (BID) | ORAL | Status: DC

## 2013-12-01 MED ORDER — SENNA 8.6 MG PO TABS: 1.0000 | ORAL_TABLET | Freq: Every day | ORAL | Status: DC

## 2013-12-01 MED ORDER — FOLIC ACID 1 MG PO TABS: 1.0000 mg | ORAL_TABLET | Freq: Every day | ORAL | Status: DC

## 2013-12-01 MED ORDER — ENOXAPARIN SODIUM 40 MG/0.4ML ~~LOC~~ SOLN: 40.0000 mg | SUBCUTANEOUS | Status: DC

## 2013-12-01 MED ORDER — ADULT MULTIVITAMIN W/MINERALS CH: 1.0000 | ORAL_TABLET | Freq: Every day | ORAL | Status: DC

## 2013-12-01 MED ORDER — FERROUS SULFATE 325 (65 FE) MG PO TABS: 325.0000 mg | ORAL_TABLET | Freq: Two times a day (BID) | ORAL | Status: DC

## 2013-12-01 MED ORDER — LORAZEPAM 0.5 MG PO TABS
0.5000 mg | ORAL_TABLET | Freq: Two times a day (BID) | ORAL | Status: DC | PRN
Start: 2013-12-01 — End: 2013-12-01

## 2013-12-01 MED ORDER — HYDRALAZINE HCL 25 MG PO TABS
25.0000 mg | ORAL_TABLET | Freq: Four times a day (QID) | ORAL | Status: DC | PRN
  Administered 2013-12-01: 25 mg via ORAL
  Filled 2013-12-01: qty 1

## 2013-12-01 MED ORDER — HYDROCODONE-ACETAMINOPHEN 5-325 MG PO TABS: 1.0000 | ORAL_TABLET | Freq: Four times a day (QID) | ORAL | Status: DC | PRN

## 2013-12-01 MED ORDER — HYDRALAZINE HCL 20 MG/ML IJ SOLN: 5.0000 mg | Freq: Four times a day (QID) | INTRAMUSCULAR | Status: DC | PRN

## 2013-12-01 MED ORDER — LORAZEPAM 0.5 MG PO TABS: 0.5000 mg | ORAL_TABLET | Freq: Two times a day (BID) | ORAL | Status: DC | PRN

## 2013-12-01 NOTE — Progress Notes (Signed)
Subjective: 3 Days Post-Op Procedure(s) (LRB): PARTIAL RIGHT HIP REPLACEMENT (BIPOLAR) (Right) Patient reports pain as moderate.    Objective: Vital signs in last 24 hours: Temp:  [98.2 F (36.8 C)-99.2 F (37.3 C)] 98.2 F (36.8 C) (07/09 0648) Pulse Rate:  [76-85] 76 (07/09 0648) Resp:  [16-20] 20 (07/09 0400) BP: (149-185)/(74-95) 185/95 mmHg (07/09 0648) SpO2:  [96 %-99 %] 96 % (07/09 0648)  Intake/Output from previous day: 07/08 0701 - 07/09 0700 In: 1210 [I.V.:1110; IV Piggyback:100] Out: 400 [Urine:400] Intake/Output this shift:   Assessment/Plan: 3 Days Post-Op Procedure(s) (LRB): PARTIAL RIGHT HIP REPLACEMENT (BIPOLAR) (Right)  HIP FRACTURE INSTRUCTIONS: TAKE STAPLES OUT POD 12 APPLY STERI STRIPS WEIGHT BEARING AS TOLERATED WITH A WALKER X 6 WEEKS  LOVENOX X 30 DAYS  HIP PRECAUTIONS: YES DIRECT LATERAL   F/U IN 4 WEEKS     [place] 12/01/2013, 8:00 AM

## 2013-12-01 NOTE — Clinical Social Work Placement (Signed)
Clinical Social Work Department CLINICAL SOCIAL WORK PLACEMENT NOTE 12/01/2013  Patient:  Erika Diaz, Erika Diaz  Account Number:  [id number] Admit date:  11/27/2013  Clinical Social Worker:  Edwyna Shell, CLINICAL SOCIAL WORKER  Date/time:  11/29/2013 10:30 AM  Clinical Social Work is seeking post-discharge placement for this patient at the following level of care:   [place]   (*CSW will update this form in Epic as items are completed)   11/29/2013  Patient/family provided with [facility] [place] Department of Clinical Social Work's list of facilities offering this level of care within the geographic area requested by the patient (or if unable, by the patient's family).  11/29/2013  Patient/family informed of their freedom to choose among providers that offer the needed level of care, that participate in Medicare, Medicaid or managed care program needed by the patient, have an available bed and are willing to accept the patient.  11/29/2013  Patient/family informed of [facility]' ownership interest in [facility], as well as of the fact that they are under no obligation to receive care at this facility.  PASARR submitted to EDS on 11/29/2013 PASARR number received on 11/29/2013  FL2 transmitted to all facilities in geographic area requested by pt/family on  11/29/2013 FL2 transmitted to all facilities within larger geographic area on   Patient informed that his/her managed care company has contracts with or will negotiate with  certain facilities, including the following:     Patient/family informed of bed offers received:  11/30/2013 Patient chooses bed at [facility] Physician recommends and patient chooses bed at    Patient to be transferred to [facility] on  12/01/2013 Patient to be transferred to facility by [facility] Patient and family notified of transfer on 12/01/2013 Name of family member notified:  Jeneen Rinks- husband  The  following physician request were entered in Epic:   Additional Comments:  Benay Pike, [place]

## 2013-12-01 NOTE — Progress Notes (Signed)
Late entry 1430 - Report called to Jeannetta Ellis at [facility] [place], [place].

## 2013-12-01 NOTE — Clinical Social Work Note (Signed)
Pt is now agreeable to go to SNF at [facility]. Discussed with pt and pt's husband. D/C today. Pt, pt's husband, and facility aware and agreeable. D/C summary faxed. [employer] calling authorization to [facility]. Pt to transfer via [facility].  Benay Pike, [place]

## 2013-12-01 NOTE — Progress Notes (Signed)
PROGRESS NOTE  Erika Diaz WNU:[id number] DOB: 1946-05-13 DOA: 11/27/2013 PCP: Redge Gainer, MD  Summary: 68 year old woman presented with history of fall onto right hip after drinking alcohol. Imaging revealed right hip fracture and laboratory evaluation revealed hyponatremia. Underwent partial right hip replacement 7/6. Physical and occupational therapy is recommended skilled nursing facility placement but patient refuses.  Assessment/Plan: 1. Right femoral neck fracture status post mechanical fall. Cleared for discharge by orthopedics, plan as below. 2. Hypoosmolar hyponatremia; hypochloremia. secondary to potomania. Clinically resolved with IV fluids. Asymptomatic. Encourage normal diet, solute intake. 3. Normocytic anemia. Suspect component of iron deficiency anemia. Oral iron on discharge. Followup as an outpatient. 4. Nausea, vomiting. Resolved. Tolerating diet. 5. Suspected alcohol withdrawal, resolved. 6. Hypertension. Stable. 7. Suspected alcohol dependence. Recommend cessation. 8. Possible dementia. Daughter reports several month history of confusion. We discussed that this is likely multifactorial, cannot rule out dementia but also consider alcohol, hyponatremia.   Continues to improve, cleared by orthopedics for discharge. Mental status stable, no evidence for significant withdrawal at this point.  Continue bowel regimen  Discussed with son and daughter, updated by telephone, discharge plans. Agreeable.  Per Dr. Aline Brochure: "HIP FRACTURE INSTRUCTIONS:  TAKE STAPLES OUT POD 12 APPLY STERI STRIPS  WEIGHT BEARING AS TOLERATED WITH A WALKER X 6 WEEKS  LOVENOX X 30 DAYS  HIP PRECAUTIONS: YES DIRECT LATERAL  F/U IN 4 WEEKS"  Code Status: full code DVT prophylaxis: Lovenox Family Communication: Discussed with husband at bedside Disposition Plan: Skilled nursing facility in the next 45 hours  Murray Hodgkins, MD  [facility]  Pager [phone] If 7PM-7AM, please  contact night-coverage at [email or web address], password [facility] 12/01/2013, 11:42 AM  LOS: 4 days   Consultants:  Orthopedics  PT: SNF  OT: SNF  Procedures:  Transfusion 2 units packed red blood cells 7/5  7/6 PARTIAL RIGHT HIP REPLACEMENT (BIPOLAR) (Right)  HPI/Subjective: Minimal use of lorazepam.  Feels better. No nausea or vomiting; no abdominal pain. Tolerating diet.  Objective: Filed Vitals:   12/01/13 0027 12/01/13 0400 12/01/13 0648 12/01/13 0800  BP: 149/81  185/95   Pulse: 81  76   Temp: 98.4 F (36.9 C)  98.2 F (36.8 C)   TempSrc: Oral  Oral   Resp: 20 20  20   Height:      Weight:      SpO2: 97% 99% 96% 96%    Intake/Output Summary (Last 24 hours) at 12/01/13 1142 Last data filed at 12/01/13 1016  Gross per 24 hour  Intake   1210 ml  Output    525 ml  Net    685 ml     Filed Weights   11/27/13 0458 11/29/13 0520  Weight: 65.6 kg (144 lb 10 oz) 72 kg (158 lb 11.7 oz)    Exam:   No hypoxia. Vitals stable. Gen. Appears comfortable, calm. Psych. Alert. Oriented to self, location, month, year. Follows commands. Grossly normal mood and affect. Cardiovascular. Regular rate and rhythm. No murmur, regular, rub or gallop. No lower extremity edema. Respiratory. Clear to auscultation bilaterally. No wheezes, rales or rhonchi. Normal respiratory effort. Abdomen. Soft, nontender, nondistended  Data Reviewed: I/O: Multiple voids. Chemistry: Potassium normalized. Sodium near normal. Chloride now normal. Creatinine normal. Heme: Leukocytosis has resolved. Hemoglobin stable at 11.2.  Scheduled Meds: . atenolol  50 mg Oral Daily  . docusate sodium  100 mg Oral BID  . enalapril  10 mg Oral Daily  . enoxaparin (LOVENOX) injection  30 mg Subcutaneous Q24H  .  ferrous sulfate  325 mg Oral BID WC  . folic acid  1 mg Oral Daily  . multivitamin with minerals  1 tablet Oral Daily  . polyethylene glycol  17 g Oral BID  . senna  1 tablet Oral BID  . thiamine  100 mg  Oral Daily   Or  . thiamine  100 mg Intravenous Daily   Continuous Infusions: . sodium chloride 100 mL/hr at 11/30/13 2125    Active Problems:   HTN (hypertension)   Closed right hip fracture   Femur fracture, right   Alcohol abuse   Hyponatremia   Hip fracture

## 2013-12-01 NOTE — Discharge Summary (Signed)
Physician Discharge Summary  Erika Diaz FBP:[id number] DOB: 1945-07-28 DOA: 11/27/2013  PCP: Redge Gainer, MD  Admit date: 11/27/2013 Discharge date: 12/01/2013  Recommendations for Outpatient Follow-up:  1. Postoperative hip rehabilitation  Per Dr. Aline Brochure:  "HIP FRACTURE INSTRUCTIONS:  TAKE STAPLES OUT POD 12 APPLY STERI STRIPS  WEIGHT BEARING AS TOLERATED WITH A WALKER X 6 WEEKS  LOVENOX X 30 DAYS  HIP PRECAUTIONS: YES DIRECT LATERAL  F/U IN 4 WEEKS"  2. Hypoosmolar hyponatremia, consider repeat BMP in one week 3. Normocytic anemia, suspected iron deficiency anemia. Followup response to iron. 4. Alcohol dependence. Continue recommend cessation. Recommended providing him with literature on treatment programs on discharge. 5. Possible dementia related by family. Consider outpatient evaluation.   Follow-up Information   Follow up with Redge Gainer, MD In 2 weeks.   Specialty:  Family Medicine   Contact information:   [place] [address] [phone]       Follow up with Arther Abbott, MD In 4 weeks.   Specialties:  Orthopedic Surgery, Radiology   Contact information:   [address] [place], [place] [address] [phone]      Discharge Diagnoses:  1. Right femoral neck fracture status post mechanical fall 2. Hypoosmolar hyponatremia, hypochloremia 3. Suspected alcohol withdrawal  4. Alcohol intoxication on admission 5. Normocytic anemia 6. Suspected alcohol dependence 7. Possible dementia  Discharge Condition: Improved Disposition: [facility] facility for short-term rehabilitation  Diet recommendation: regular diet  Filed Weights   11/27/13 0458 11/29/13 0520  Weight: 65.6 kg (144 lb 10 oz) 72 kg (158 lb 11.7 oz)    History of present illness:  68 year old woman presented with history of fall onto right hip after drinking alcohol. Imaging revealed right hip fracture and  laboratory evaluation revealed hyponatremia.   Hospital Course:  Erika Diaz underwent partial right hip replacement 7/6 without complication. She does have a history of alcohol abuse/dependence and experienced mild alcohol withdrawal. This has resolved. Hyponatremia much improved with administration of fluids/solute. Physical and occupational therapy is recommended skilled nursing facility placement. Clear for discharge per orthopedics with instructions as above.  1. Right femoral neck fracture status post mechanical fall. Cleared for discharge by orthopedics. 2. Hypoosmolar hyponatremia; hypochloremia. secondary to potomania. Clinically resolved with IV fluids. Asymptomatic. Encourage normal diet, solute intake. 3. Normocytic anemia. Suspect component of iron deficiency anemia. Oral iron on discharge. Followup as an outpatient. 4. Nausea, vomiting. Resolved. Tolerating diet. 5. Suspected alcohol withdrawal, resolved. 6. Hypertension. Stable. 7. Suspected alcohol dependence. Recommend cessation. 8. Possible dementia. Daughter reports several month history of confusion. We discussed that this is likely multifactorial, cannot rule out dementia but also consider alcohol, hyponatremia.  Consultants:  Orthopedics  PT: SNF  OT: SNF Procedures:  Transfusion 2 units packed red blood cells 7/5  7/6 PARTIAL RIGHT HIP REPLACEMENT (BIPOLAR) (Right)  Discharge Instructions  Discharge Instructions   Discharge instructions    Complete by:  As directed   Call physician or seek immediate medical assistance for fever, increased pain, confusion or worsening of condition.            Medication List         atenolol 50 MG tablet  Commonly known as:  TENORMIN  Take 50 mg by mouth daily.     enalapril 10 MG tablet  Commonly known as:  VASOTEC  Take 10 mg by mouth daily.     enoxaparin 40 MG/0.4ML injection  Commonly known as:  LOVENOX  Inject 0.4 mLs (40 mg total) into the skin daily. Last dose  August 5th.     ferrous sulfate 325 (65 FE) MG tablet  Take 1 tablet (325 mg total) by mouth 2 (two) times daily with a meal.     folic acid 1 MG tablet  Commonly known as:  FOLVITE  Take 1 tablet (1 mg total) by mouth daily.     HYDROcodone-acetaminophen 5-325 MG per tablet  Commonly known as:  NORCO/VICODIN  Take 1-2 tablets by mouth every 6 (six) hours as needed for moderate pain.     LORazepam 0.5 MG tablet  Commonly known as:  ATIVAN  Take 1 tablet (0.5 mg total) by mouth every 12 (twelve) hours as needed for anxiety.     multivitamin with minerals Tabs tablet  Take 1 tablet by mouth daily.     polyethylene glycol packet  Commonly known as:  MIRALAX / GLYCOLAX  Take 17 g by mouth 2 (two) times daily.     senna 8.6 MG Tabs tablet  Commonly known as:  SENOKOT  Take 1 tablet (8.6 mg total) by mouth at bedtime.       No Known Allergies The results of significant diagnostics from this hospitalization (including imaging, microbiology, ancillary and laboratory) are listed below for reference.    Significant Diagnostic Studies: Dg Chest 1 View  11/27/2013   CLINICAL DATA:  Fall with hip pain  EXAM: CHEST - 1 VIEW  COMPARISON:  None.  FINDINGS: Normal heart size and mediastinal contours. No acute infiltrate or edema. No effusion or pneumothorax. No acute osseous findings.  IMPRESSION: No active disease.   Electronically Signed   By: Jorje Guild M.D.   On: 11/27/2013 01:39   Dg Hip Complete Right  11/27/2013   CLINICAL DATA:  Fall.  Hip pain.  EXAM: RIGHT HIP - COMPLETE 2+ VIEW  COMPARISON:  None.  FINDINGS: There is an acute proximal right femur fracture with varus angulation. The fracture is best characterized as a transcervical or basicervical femoral neck fracture. No evidence of pelvic ring disruption. The femoral heads are located.  IMPRESSION: Displaced right femoral neck fracture.   Electronically Signed   By: Jorje Guild M.D.   On: 11/27/2013 01:39   Ct Cervical  Spine Wo Contrast  11/27/2013   CLINICAL DATA:  Fall down steps.  Bilateral neck pain.  EXAM: CT CERVICAL SPINE WITHOUT CONTRAST  TECHNIQUE: Multidetector CT imaging of the cervical spine was performed without intravenous contrast. Multiplanar CT image reconstructions were also generated.  COMPARISON:  None.  FINDINGS: Degenerative disc changes in the lower cervical spine. Multilevel bilateral degenerative facet disease. No fracture or subluxation. No epidural or paraspinal hematoma.  IMPRESSION: No acute bony abnormality.   Electronically Signed   By: Rolm Baptise M.D.   On: 11/27/2013 01:59   Dg Pelvis Portable  11/28/2013   CLINICAL DATA:  Right hip replacement.  EXAM: PORTABLE PELVIS 1-2 VIEWS  COMPARISON:  None.  FINDINGS: Patient status post right hip replacement with good anatomic alignment on AP view. No acute bony abnormality.  IMPRESSION: Patient status post right hip replacement with good anatomic alignment.   Electronically Signed   By: Marcello Moores  Register   On: 11/28/2013 14:46    Microbiology: Recent Results (from the past 240 hour(s))  SURGICAL PCR SCREEN     Status: None   Collection Time    11/27/13  3:45 PM      Result  Value Ref Range Status   MRSA, PCR NEGATIVE  NEGATIVE Final   Staphylococcus aureus NEGATIVE  NEGATIVE Final   Comment:            The Xpert SA Assay (FDA     approved for NASAL specimens     in patients over 8 years of age),     is one component of     a comprehensive surveillance     program.  Test performance has     been validated by [employer] for patients greater     than or equal to 33 year old.     It is not intended     to diagnose infection nor to     guide or monitor treatment.     Labs: Basic Metabolic Panel:  Recent Labs Lab 11/27/13 0601 11/28/13 0650 11/29/13 0600 11/30/13 1440 12/01/13 0615  NA 123* 127* 128* 130* 133*  K 3.9 3.7 3.2* 3.2* 4.1  CL 86* 91* 92* 92* 99  CO2 20 22 23 24 25   GLUCOSE 118* 117* 118* 102* 83  BUN  5* 9 7 6 9   CREATININE 0.70 0.70 0.67 0.60 0.66  CALCIUM 8.6 8.9 8.3* 8.7 8.7   Liver Function Tests:  Recent Labs Lab 11/27/13 0139  AST 23  ALT 22  ALKPHOS 83  BILITOT 0.2*  PROT 7.1  ALBUMIN 3.6   CBC:  Recent Labs Lab 11/27/13 0139 11/27/13 0601 11/28/13 0650 11/29/13 0600 11/30/13 1440 12/01/13 0615  WBC 13.1* 12.5* 12.1* 15.2* 14.1* 9.5  NEUTROABS 10.8*  --   --   --   --   --   HGB 10.1* 9.9* 13.1 11.7* 12.3 11.2*  HCT 28.7* 28.2* 36.8 33.1* 34.6* 32.3*  MCV 82.9 82.9 84.6 84.7 85.4 86.8  PLT 268 267 218 189 186 199    Active Problems:   HTN (hypertension)   Closed right hip fracture   Femur fracture, right   Alcohol abuse   Hyponatremia   Hip fracture   Time coordinating discharge: 35 minutes  Signed:  Murray Hodgkins, MD [facility] 12/01/2013, 12:30 PM

## 2013-12-01 NOTE — Progress Notes (Signed)
Late entry:  Patient's BP improved.  Dr. Sarajane Jews notified.  Patient transported by EMS to [facility] at discharge.  Patient stable at time of discharge.

## 2013-12-01 NOTE — Care Management Note (Signed)
    Page 1 of 1   12/01/2013     3:55:10 PM CARE MANAGEMENT NOTE 12/01/2013  Patient:  RAIDYN, BREINER   Account Number:  [id number]  Date Initiated:  12/01/2013  Documentation initiated by:  Vladimir Creeks  Subjective/Objective Assessment:   Pt admitted with rt hip fx. Pt is from home with spouse. She is ETOH +, also. She would prefer to go home with spouse, but has agreed to go to [facility] short term, because spouse, and son, along with staff have all encouraged this.     Action/Plan:   She is not very stable, and would benefit greatly from 2-3 weeks of rehab to strengthen and increase stamina.   Anticipated DC Date:  12/01/2013   Anticipated DC Plan:  SKILLED NURSING FACILITY  In-house referral  Clinical Social Worker      DC Planning Services  CM consult      [facility]  LONG TERM ACUTE CARE   Choice offered to / List presented to:             Status of service:  Completed, signed off Medicare Important Message given?  YES (If response is "NO", the following Medicare IM given date fields will be blank) Date Medicare IM given:  12/01/2013 Medicare IM given by:  Vladimir Creeks Date Additional Medicare IM given:   Additional Medicare IM given by:    Discharge Disposition:  [place]  Per UR Regulation:  Reviewed for med. necessity/level of care/duration of stay  If discussed at [place] of Stay Meetings, dates discussed:    Comments:  12/01/13 [place] RN/CM

## 2013-12-01 NOTE — Progress Notes (Signed)
Patient's BP 194/80.  Dr. Sarajane Jews on unit and notified.  PRN medication ordered.

## 2013-12-02 NOTE — Progress Notes (Signed)
UR chart review completed.  

## 2013-12-05 ENCOUNTER — Non-Acute Institutional Stay (SKILLED_NURSING_FACILITY): Payer: Commercial Managed Care - HMO | Admitting: Internal Medicine

## 2013-12-05 DIAGNOSIS — F1010 Alcohol abuse, uncomplicated: Secondary | ICD-10-CM

## 2013-12-05 DIAGNOSIS — S72141D Displaced intertrochanteric fracture of right femur, subsequent encounter for closed fracture with routine healing: Secondary | ICD-10-CM

## 2013-12-05 DIAGNOSIS — E871 Hypo-osmolality and hyponatremia: Secondary | ICD-10-CM

## 2013-12-05 DIAGNOSIS — D62 Acute posthemorrhagic anemia: Secondary | ICD-10-CM

## 2013-12-05 DIAGNOSIS — S72009D Fracture of unspecified part of neck of unspecified femur, subsequent encounter for closed fracture with routine healing: Secondary | ICD-10-CM

## 2013-12-05 NOTE — Progress Notes (Signed)
Patient ID: Erika Diaz, female   DOB: 22-Feb-1946, 68 y.o.   MRN: [id number]  Facility; [facility] Chief complaint; admission to SNF post admit to [facility] from 7/5 to 7/9  History ; history this is a 68 year old woman who tells me that she missed a step going down the stairs in her home. She suffered a right femoral neck fracture and underwent a right hip replacement. It was noted that she had suspected alcoholic crawl and suspected alcohol dependence. She was also noted to be hyponatremic with a sodium of 123 which was felt to be hypoosmolar. Presumably corrected with isotonic fluid this was 133 on discharge. He was also suspected that she had a component of iron deficiency anemia and was discharged on oral iron.  There was also a question raised by her family about a several month history of confusion. It was some concern about an early dementia  Past Medical History  Diagnosis Date  . Hypertension    Past Surgical History  Procedure Laterality Date  . Breast surgery Right     lump removal   . Hip arthroplasty Right 11/28/2013    Procedure: PARTIAL RIGHT HIP REPLACEMENT (BIPOLAR);  Surgeon: Carole Civil, MD;  Location: [facility];  Service: Orthopedics;  Laterality: Right;   Current Outpatient Prescriptions on File Prior to Visit  Medication Sig Dispense Refill  . atenolol (TENORMIN) 50 MG tablet Take 50 mg by mouth daily.      . enalapril (VASOTEC) 10 MG tablet Take 10 mg by mouth daily.      Marland Kitchen enoxaparin (LOVENOX) 40 MG/0.4ML injection Inject 0.4 mLs (40 mg total) into the skin daily. Last dose August 5th.  0 Syringe    . ferrous sulfate 325 (65 FE) MG tablet Take 1 tablet (325 mg total) by mouth 2 (two) times daily with a meal.      . folic acid (FOLVITE) 1 MG tablet Take 1 tablet (1 mg total) by mouth daily.      Marland Kitchen HYDROcodone-acetaminophen (NORCO/VICODIN) 5-325 MG per tablet Take 1-2 tablets by mouth every 6 (six) hours as needed for moderate pain.  30 tablet  0  .  LORazepam (ATIVAN) 0.5 MG tablet Take 1 tablet (0.5 mg total) by mouth every 12 (twelve) hours as needed for anxiety.  20 tablet  0  . Multiple Vitamin (MULTIVITAMIN WITH MINERALS) TABS tablet Take 1 tablet by mouth daily.      . polyethylene glycol (MIRALAX / GLYCOLAX) packet Take 17 g by mouth 2 (two) times daily.      Marland Kitchen senna (SENOKOT) 8.6 MG TABS tablet Take 1 tablet (8.6 mg total) by mouth at bedtime.           Social: Patient lives at home with her husband. Claims to be independent with ADLs and IADLs. She denies smoking. She also completely denies drinking excessively although she appears to admit to drinking beer daily.  reports that she quit smoking about 9 years ago. She does not have any smokeless tobacco history on file. She reports that she drinks alcohol. She reports that she does not use illicit drugs.  indicated that her mother is deceased. She indicated that her father is deceased. She has 2 children alive and well including a son who lives next-door  Review of systems Gen. patient states she spends a lot of time in the sun Respiratory no cough no shortness of breath Cardiac no chest pain GI had a good bowel movement this morning GU no  dysuria Musculoskeletal no history of falls does not specifically know her bone density  Physical examination HEENT no oral lesions are seen Respiratory; clear entry bilaterally Cardiac; heart sounds are normal there is no gallops no murmurs Abdomen; possible tip of her liver at the costal margin there is no spleen no stigmata GU bladder not distended no CVA tenderness Extremities surgical scar in the right hip is well opposed no evidence of infection Mental status; no obvious deficits here  Impression/plan #1 status post right hip fracture #2 listed as having possible alcohol dependence and mild withdrawal. I opened the line of discussion with her although she denies this  #3 hyponatremia; this will need to be followed up with lab  work this week #4 iron deficiency anemia the hemoglobin will be rechecked #5 on Lovenox for DVT prophylaxis #6 hypertension we'll need to monitor while she is here  Patient should have a bone density scan when she is more stable. I don't believe she has been following that closely with her primary physician's office

## 2013-12-06 ENCOUNTER — Other Ambulatory Visit: Payer: Self-pay | Admitting: *Deleted

## 2013-12-06 DIAGNOSIS — S72001S Fracture of unspecified part of neck of right femur, sequela: Secondary | ICD-10-CM

## 2013-12-24 DIAGNOSIS — F0390 Unspecified dementia without behavioral disturbance: Secondary | ICD-10-CM

## 2013-12-24 DIAGNOSIS — Z471 Aftercare following joint replacement surgery: Secondary | ICD-10-CM

## 2013-12-24 DIAGNOSIS — I10 Essential (primary) hypertension: Secondary | ICD-10-CM

## 2013-12-24 DIAGNOSIS — D649 Anemia, unspecified: Secondary | ICD-10-CM

## 2013-12-29 ENCOUNTER — Other Ambulatory Visit (INDEPENDENT_AMBULATORY_CARE_PROVIDER_SITE_OTHER): Payer: Commercial Managed Care - HMO

## 2013-12-29 DIAGNOSIS — N390 Urinary tract infection, site not specified: Secondary | ICD-10-CM

## 2013-12-29 LAB — POCT UA - MICROSCOPIC ONLY
CRYSTALS, UR, HPF, POC: NEGATIVE
Casts, Ur, LPF, POC: NEGATIVE
Mucus, UA: NEGATIVE
Yeast, UA: NEGATIVE

## 2013-12-29 LAB — POCT URINALYSIS DIPSTICK
BILIRUBIN UA: NEGATIVE
Glucose, UA: NEGATIVE
KETONES UA: NEGATIVE
NITRITE UA: POSITIVE
PROTEIN UA: NEGATIVE
Spec Grav, UA: <=1.005
Urobilinogen, UA: NEGATIVE
pH, UA: 5

## 2013-12-29 NOTE — Progress Notes (Signed)
Pt dropped off urine sample only

## 2014-01-02 ENCOUNTER — Ambulatory Visit (INDEPENDENT_AMBULATORY_CARE_PROVIDER_SITE_OTHER): Payer: Commercial Managed Care - HMO | Admitting: Orthopedic Surgery

## 2014-01-02 VITALS — BP 145/77 | Ht 63.0 in | Wt 158.0 lb

## 2014-01-02 DIAGNOSIS — S72009D Fracture of unspecified part of neck of unspecified femur, subsequent encounter for closed fracture with routine healing: Secondary | ICD-10-CM

## 2014-01-02 DIAGNOSIS — S72001D Fracture of unspecified part of neck of right femur, subsequent encounter for closed fracture with routine healing: Secondary | ICD-10-CM

## 2014-01-02 DIAGNOSIS — S72009A Fracture of unspecified part of neck of unspecified femur, initial encounter for closed fracture: Secondary | ICD-10-CM

## 2014-01-02 HISTORY — DX: Fracture of unspecified part of neck of unspecified femur, initial encounter for closed fracture: S72.009A

## 2014-01-02 NOTE — Progress Notes (Signed)
Chief Complaint  Patient presents with  . Follow-up    Hospital follow up, right hip. DOS 11-27-13.   HISTORY: Status post hip fracture status post bipolar. Doing well no problems at this point  Recommend outpatient physical therapy for 2 months and followup with me.  Today we note her leg lengths are equal hip flexion is normal she has good active motion in her hip

## 2014-01-02 NOTE — Patient Instructions (Signed)
Call to arrange therapy 

## 2014-01-18 ENCOUNTER — Other Ambulatory Visit: Payer: Self-pay | Admitting: Internal Medicine

## 2014-03-07 ENCOUNTER — Ambulatory Visit: Payer: Medicare HMO | Admitting: Orthopedic Surgery

## 2014-03-07 ENCOUNTER — Encounter: Payer: Self-pay | Admitting: Orthopedic Surgery

## 2014-06-05 ENCOUNTER — Other Ambulatory Visit: Payer: Self-pay | Admitting: Nurse Practitioner

## 2014-06-06 NOTE — Telephone Encounter (Signed)
Refilled once. NTBS. Last visit 5/15

## 2014-07-07 ENCOUNTER — Other Ambulatory Visit: Payer: Self-pay | Admitting: Family Medicine

## 2014-08-13 ENCOUNTER — Other Ambulatory Visit: Payer: Self-pay | Admitting: Family Medicine

## 2014-08-21 ENCOUNTER — Other Ambulatory Visit: Payer: Self-pay | Admitting: Internal Medicine

## 2014-08-23 ENCOUNTER — Other Ambulatory Visit: Payer: Self-pay | Admitting: *Deleted

## 2014-08-23 MED ORDER — ATENOLOL 50 MG PO TABS: 50.0000 mg | ORAL_TABLET | Freq: Every day | ORAL | Status: DC

## 2014-09-15 ENCOUNTER — Other Ambulatory Visit: Payer: Self-pay | Admitting: Family Medicine

## 2014-09-19 ENCOUNTER — Other Ambulatory Visit: Payer: Self-pay | Admitting: Family Medicine

## 2014-10-02 ENCOUNTER — Telehealth: Payer: Self-pay | Admitting: Family Medicine

## 2014-10-02 ENCOUNTER — Other Ambulatory Visit: Payer: Self-pay | Admitting: Family Medicine

## 2014-10-02 NOTE — Telephone Encounter (Signed)
Patient states that she already had an appointment for tomorrow somewhere else.

## 2014-10-04 ENCOUNTER — Other Ambulatory Visit (HOSPITAL_COMMUNITY): Payer: Self-pay | Admitting: Physician Assistant

## 2014-10-04 DIAGNOSIS — Z13820 Encounter for screening for osteoporosis: Secondary | ICD-10-CM

## 2014-10-04 DIAGNOSIS — Z1231 Encounter for screening mammogram for malignant neoplasm of breast: Secondary | ICD-10-CM

## 2014-10-11 ENCOUNTER — Ambulatory Visit (HOSPITAL_COMMUNITY)
Admission: RE | Admit: 2014-10-11 | Discharge: 2014-10-11 | Disposition: A | Discharge status: Home / Self Care | Payer: Medicare HMO | Source: Ambulatory Visit | Attending: Physician Assistant | Admitting: Physician Assistant

## 2014-10-11 DIAGNOSIS — Z1231 Encounter for screening mammogram for malignant neoplasm of breast: Secondary | ICD-10-CM | POA: Insufficient documentation

## 2014-10-11 DIAGNOSIS — Z13820 Encounter for screening for osteoporosis: Secondary | ICD-10-CM | POA: Diagnosis not present

## 2014-10-11 DIAGNOSIS — Z780 Asymptomatic menopausal state: Secondary | ICD-10-CM | POA: Diagnosis not present

## 2014-10-17 ENCOUNTER — Other Ambulatory Visit (HOSPITAL_COMMUNITY): Payer: Self-pay | Admitting: Physician Assistant

## 2014-10-17 DIAGNOSIS — Z13820 Encounter for screening for osteoporosis: Secondary | ICD-10-CM

## 2014-10-17 DIAGNOSIS — Z1239 Encounter for other screening for malignant neoplasm of breast: Secondary | ICD-10-CM

## 2015-09-21 ENCOUNTER — Other Ambulatory Visit: Payer: Self-pay

## 2015-09-21 DIAGNOSIS — Z1231 Encounter for screening mammogram for malignant neoplasm of breast: Secondary | ICD-10-CM

## 2015-10-15 ENCOUNTER — Ambulatory Visit: Payer: Medicare HMO

## 2016-09-11 ENCOUNTER — Encounter: Payer: Self-pay | Admitting: Internal Medicine

## 2016-10-08 ENCOUNTER — Ambulatory Visit: Payer: Medicare HMO | Admitting: Internal Medicine

## 2016-10-08 ENCOUNTER — Telehealth: Payer: Self-pay | Admitting: Internal Medicine

## 2016-10-08 NOTE — Telephone Encounter (Signed)
No charge. 

## 2016-11-12 ENCOUNTER — Encounter (INDEPENDENT_AMBULATORY_CARE_PROVIDER_SITE_OTHER): Payer: Self-pay

## 2016-11-12 ENCOUNTER — Ambulatory Visit (INDEPENDENT_AMBULATORY_CARE_PROVIDER_SITE_OTHER): Payer: Medicare HMO | Admitting: Internal Medicine

## 2016-11-12 ENCOUNTER — Encounter: Payer: Self-pay | Admitting: Internal Medicine

## 2016-11-12 VITALS — BP 134/56 | HR 68 | Ht 61.0 in | Wt 119.5 lb

## 2016-11-12 DIAGNOSIS — D500 Iron deficiency anemia secondary to blood loss (chronic): Secondary | ICD-10-CM

## 2016-11-12 DIAGNOSIS — R634 Abnormal weight loss: Secondary | ICD-10-CM

## 2016-11-12 DIAGNOSIS — K625 Hemorrhage of anus and rectum: Secondary | ICD-10-CM

## 2016-11-12 DIAGNOSIS — R195 Other fecal abnormalities: Secondary | ICD-10-CM | POA: Diagnosis not present

## 2016-11-12 MED ORDER — NA SULFATE-K SULFATE-MG SULF 17.5-3.13-1.6 GM/177ML PO SOLN: 1.0000 | Freq: Once | ORAL | 0 refills | Status: AC

## 2016-11-12 NOTE — Progress Notes (Signed)
HISTORY OF PRESENT ILLNESS:  Erika Diaz is a 71 y.o. female with past medical history as listed below who is referred by her primary care office regarding rectal bleeding and Hemoccult-positive stool. Patient reports having had rectal bleeding couple months ago. She described painless hematochezia. She was evaluated and found to have Hemoccult positive stool as well. She has not had prior GI evaluations or colonoscopy. She did have blood work performed. I have reviewed this. Hemoglobin level was low at 9.9 in April. MCV low at 79. Iron studies reveal iron deficiency. She denies any GI complaints such as change in bowel habits or abdominal pain. She was told that she has had some weight loss but cannot specify. Her weight is down 5 pounds from April by documentation. She is performed smoker. She does consume several alcoholic beverages daily. No prior abdominal surgery.  REVIEW OF SYSTEMS:  All non-GI ROS negative upon comprehensive review  Past Medical History:  Diagnosis Date  . Anemia   . Anxiety   . Hypertension   . Vitamin D deficiency     Past Surgical History:  Procedure Laterality Date  . BREAST LUMPECTOMY Right    lump removal   . HIP ARTHROPLASTY Right 11/28/2013   Procedure: PARTIAL RIGHT HIP REPLACEMENT (BIPOLAR);  Surgeon: Carole Civil, MD;  Location: [facility];  Service: Orthopedics;  Laterality: Right;    Social History [name]  reports that she quit smoking about 12 years ago. She has never used smokeless tobacco. She reports that she drinks alcohol. She reports that she does not use drugs.  family history includes Diabetes in her mother; Stomach cancer in her mother.  No Known Allergies     PHYSICAL EXAMINATION: Vital signs: BP (!) 134/56 (BP Location: Left Arm, Patient Position: Sitting, Cuff Size: Normal)   Pulse 68   Ht 5\' 1"  (1.549 m) Comment: height measured without shoes  Wt 119 lb 8 oz (54.2 kg)   BMI 22.58 kg/m   Constitutional:Thin, tan,  generally well-appearing, no acute distress Psychiatric: alert and oriented x3, cooperative Eyes: extraocular movements intact, anicteric, conjunctiva pink Mouth: oral pharynx moist, no lesions Neck: supple no lymphadenopathy Cardiovascular: heart regular rate and rhythm, no murmur Lungs: clear to auscultation bilaterally Abdomen: soft, nontender, nondistended, no obvious ascites, no peritoneal signs, normal bowel sounds, no organomegaly Rectal:Deferred until colonoscopy Extremities: no clubbing cyanosis or lower extremity edema bilaterally Skin: no lesions on visible extremities Neuro: No focal deficits. No asterixis.    ASSESSMENT:  #1. Painless hematochezia. Rule out neoplasia #2. Hemoccult-positive stool. Rule out GI mucosal lesion #3. Iron deficiency anemia. Rule out GI mucosal lesion #4. Weight loss  PLAN:  #1. Colonoscopy. Rule out colorectal neoplasia.The nature of the procedure, as well as the risks, benefits, and alternatives were carefully and thoroughly reviewed with the patient. Ample time for discussion and questions allowed. The patient understood, was satisfied, and agreed to proceed. #2. Will need EGD if colonoscopy negative to rule out upper GI mucosal lesion. We will schedule after colonoscopy if necessary #3. Will need iron replacement therapy

## 2016-11-12 NOTE — Patient Instructions (Signed)

## 2016-12-08 ENCOUNTER — Emergency Department (HOSPITAL_COMMUNITY): Payer: Medicare HMO | Admitting: Anesthesiology

## 2016-12-08 ENCOUNTER — Encounter (HOSPITAL_COMMUNITY): Payer: Self-pay | Admitting: Emergency Medicine

## 2016-12-08 ENCOUNTER — Inpatient Hospital Stay (HOSPITAL_COMMUNITY)
Admission: EM | Admit: 2016-12-08 | Discharge: 2016-12-13 | DRG: 329 | Disposition: A | Discharge status: Home Health | Payer: Medicare HMO | Attending: General Surgery | Admitting: General Surgery

## 2016-12-08 ENCOUNTER — Emergency Department (HOSPITAL_COMMUNITY): Payer: Medicare HMO

## 2016-12-08 ENCOUNTER — Encounter (HOSPITAL_COMMUNITY)
Admission: EM | Disposition: A | Discharge status: Home Health | Payer: Self-pay | Source: Home / Self Care | Attending: General Surgery

## 2016-12-08 DIAGNOSIS — Z96641 Presence of right artificial hip joint: Secondary | ICD-10-CM | POA: Diagnosis present

## 2016-12-08 DIAGNOSIS — K651 Peritoneal abscess: Secondary | ICD-10-CM | POA: Diagnosis present

## 2016-12-08 DIAGNOSIS — Z833 Family history of diabetes mellitus: Secondary | ICD-10-CM

## 2016-12-08 DIAGNOSIS — I10 Essential (primary) hypertension: Secondary | ICD-10-CM | POA: Diagnosis present

## 2016-12-08 DIAGNOSIS — Z87891 Personal history of nicotine dependence: Secondary | ICD-10-CM | POA: Diagnosis not present

## 2016-12-08 DIAGNOSIS — Z9049 Acquired absence of other specified parts of digestive tract: Secondary | ICD-10-CM

## 2016-12-08 DIAGNOSIS — C187 Malignant neoplasm of sigmoid colon: Secondary | ICD-10-CM | POA: Diagnosis present

## 2016-12-08 DIAGNOSIS — D62 Acute posthemorrhagic anemia: Secondary | ICD-10-CM | POA: Diagnosis not present

## 2016-12-08 DIAGNOSIS — K668 Other specified disorders of peritoneum: Secondary | ICD-10-CM

## 2016-12-08 DIAGNOSIS — Z800 Family history of malignant neoplasm of digestive organs: Secondary | ICD-10-CM

## 2016-12-08 DIAGNOSIS — K631 Perforation of intestine (nontraumatic): Secondary | ICD-10-CM

## 2016-12-08 DIAGNOSIS — K56609 Unspecified intestinal obstruction, unspecified as to partial versus complete obstruction: Secondary | ICD-10-CM

## 2016-12-08 DIAGNOSIS — R1031 Right lower quadrant pain: Secondary | ICD-10-CM | POA: Diagnosis present

## 2016-12-08 HISTORY — DX: Acquired absence of other specified parts of digestive tract: Z90.49

## 2016-12-08 HISTORY — PX: LAPAROTOMY: SHX154

## 2016-12-08 LAB — URINALYSIS, ROUTINE W REFLEX MICROSCOPIC
BILIRUBIN URINE: NEGATIVE
Glucose, UA: NEGATIVE mg/dL
Hgb urine dipstick: NEGATIVE
Ketones, ur: 5 mg/dL — AB
LEUKOCYTES UA: NEGATIVE
NITRITE: NEGATIVE
PH: 5 (ref 5.0–8.0)
Protein, ur: NEGATIVE mg/dL
SPECIFIC GRAVITY, URINE: 1.043 — AB (ref 1.005–1.030)

## 2016-12-08 LAB — COMPREHENSIVE METABOLIC PANEL
ALBUMIN: 3 g/dL — AB (ref 3.5–5.0)
ALT: 12 U/L — ABNORMAL LOW (ref 14–54)
ANION GAP: 13 (ref 5–15)
AST: 24 U/L (ref 15–41)
Alkaline Phosphatase: 65 U/L (ref 38–126)
BILIRUBIN TOTAL: 0.7 mg/dL (ref 0.3–1.2)
BUN: 25 mg/dL — ABNORMAL HIGH (ref 6–20)
CO2: 27 mmol/L (ref 22–32)
Calcium: 9.2 mg/dL (ref 8.9–10.3)
Chloride: 90 mmol/L — ABNORMAL LOW (ref 101–111)
Creatinine, Ser: 1.28 mg/dL — ABNORMAL HIGH (ref 0.44–1.00)
GFR calc non Af Amer: 41 mL/min — ABNORMAL LOW (ref >60)
GFR, EST AFRICAN AMERICAN: 48 mL/min — AB (ref >60)
GLUCOSE: 122 mg/dL — AB (ref 65–99)
POTASSIUM: 4.1 mmol/L (ref 3.5–5.1)
SODIUM: 130 mmol/L — AB (ref 135–145)
TOTAL PROTEIN: 7.4 g/dL (ref 6.5–8.1)

## 2016-12-08 LAB — LIPASE, BLOOD: Lipase: 19 U/L (ref 11–51)

## 2016-12-08 LAB — CBC
HEMATOCRIT: 31.2 % — AB (ref 36.0–46.0)
HEMOGLOBIN: 10.1 g/dL — AB (ref 12.0–15.0)
MCH: 24.1 pg — ABNORMAL LOW (ref 26.0–34.0)
MCHC: 32.4 g/dL (ref 30.0–36.0)
MCV: 74.5 fL — ABNORMAL LOW (ref 78.0–100.0)
Platelets: 494 10*3/uL — ABNORMAL HIGH (ref 150–400)
RBC: 4.19 MIL/uL (ref 3.87–5.11)
RDW: 16.2 % — ABNORMAL HIGH (ref 11.5–15.5)
WBC: 19.1 10*3/uL — ABNORMAL HIGH (ref 4.0–10.5)

## 2016-12-08 SURGERY — LAPAROTOMY, EXPLORATORY
Anesthesia: General

## 2016-12-08 MED ORDER — SUCCINYLCHOLINE CHLORIDE 20 MG/ML IJ SOLN
INTRAMUSCULAR | Status: AC
  Filled 2016-12-08: qty 1

## 2016-12-08 MED ORDER — HYDROCODONE-ACETAMINOPHEN 5-325 MG PO TABS: 1.0000 | ORAL_TABLET | ORAL | Status: DC | PRN

## 2016-12-08 MED ORDER — SODIUM CHLORIDE 0.9 % IV BOLUS (SEPSIS)
1000.0000 mL | Freq: Once | INTRAVENOUS | Status: AC
  Administered 2016-12-08: 1000 mL via INTRAVENOUS

## 2016-12-08 MED ORDER — SODIUM CHLORIDE 0.9 % IJ SOLN
INTRAMUSCULAR | Status: AC
  Filled 2016-12-08: qty 10

## 2016-12-08 MED ORDER — ATROPINE SULFATE 0.4 MG/ML IJ SOLN
INTRAMUSCULAR | Status: AC
  Filled 2016-12-08: qty 1

## 2016-12-08 MED ORDER — DEXAMETHASONE SODIUM PHOSPHATE 4 MG/ML IJ SOLN
INTRAMUSCULAR | Status: DC | PRN
  Administered 2016-12-08: 4 mg via INTRAVENOUS

## 2016-12-08 MED ORDER — LACTATED RINGERS IV SOLN
INTRAVENOUS | Status: DC
  Administered 2016-12-08 – 2016-12-10 (×3): via INTRAVENOUS

## 2016-12-08 MED ORDER — ONDANSETRON HCL 4 MG/2ML IJ SOLN
INTRAMUSCULAR | Status: AC
  Filled 2016-12-08: qty 2

## 2016-12-08 MED ORDER — ACETAMINOPHEN 650 MG RE SUPP: 650.0000 mg | Freq: Four times a day (QID) | RECTAL | Status: DC | PRN

## 2016-12-08 MED ORDER — MIDAZOLAM HCL 2 MG/2ML IJ SOLN
INTRAMUSCULAR | Status: AC
  Filled 2016-12-08: qty 2

## 2016-12-08 MED ORDER — SUCCINYLCHOLINE CHLORIDE 20 MG/ML IJ SOLN
INTRAMUSCULAR | Status: DC | PRN
  Administered 2016-12-08: 100 mg via INTRAVENOUS

## 2016-12-08 MED ORDER — LORAZEPAM 2 MG/ML IJ SOLN
0.5000 mg | INTRAMUSCULAR | Status: DC | PRN
  Administered 2016-12-11: 0.5 mg via INTRAVENOUS
  Filled 2016-12-08: qty 1

## 2016-12-08 MED ORDER — ENOXAPARIN SODIUM 40 MG/0.4ML ~~LOC~~ SOLN
40.0000 mg | SUBCUTANEOUS | Status: DC
  Administered 2016-12-09 – 2016-12-12 (×4): 40 mg via SUBCUTANEOUS
  Filled 2016-12-08 (×4): qty 0.4

## 2016-12-08 MED ORDER — POVIDONE-IODINE 10 % EX OINT
TOPICAL_OINTMENT | CUTANEOUS | Status: AC
  Filled 2016-12-08: qty 1

## 2016-12-08 MED ORDER — CHLORHEXIDINE GLUCONATE CLOTH 2 % EX PADS: 6.0000 | MEDICATED_PAD | Freq: Once | CUTANEOUS | Status: DC

## 2016-12-08 MED ORDER — PIPERACILLIN-TAZOBACTAM 3.375 G IVPB
3.3750 g | Freq: Three times a day (TID) | INTRAVENOUS | Status: DC
  Administered 2016-12-09 – 2016-12-13 (×13): 3.375 g via INTRAVENOUS
  Filled 2016-12-08 (×13): qty 50

## 2016-12-08 MED ORDER — GLYCOPYRROLATE 0.2 MG/ML IJ SOLN
INTRAMUSCULAR | Status: AC
  Filled 2016-12-08: qty 3

## 2016-12-08 MED ORDER — SODIUM CHLORIDE 0.9 % IV SOLN
INTRAVENOUS | Status: DC | PRN
  Administered 2016-12-08: 17:00:00 via INTRAVENOUS

## 2016-12-08 MED ORDER — HYDROMORPHONE HCL 1 MG/ML IJ SOLN
0.5000 mg | INTRAMUSCULAR | Status: DC | PRN
  Administered 2016-12-09 – 2016-12-10 (×2): 0.5 mg via INTRAVENOUS
  Filled 2016-12-08 (×2): qty 1

## 2016-12-08 MED ORDER — KETOROLAC TROMETHAMINE 30 MG/ML IJ SOLN
30.0000 mg | Freq: Once | INTRAMUSCULAR | Status: AC
  Administered 2016-12-08: 30 mg via INTRAVENOUS

## 2016-12-08 MED ORDER — IOPAMIDOL (ISOVUE-300) INJECTION 61%
100.0000 mL | Freq: Once | INTRAVENOUS | Status: AC | PRN
  Administered 2016-12-08: 100 mL via INTRAVENOUS

## 2016-12-08 MED ORDER — DEXAMETHASONE SODIUM PHOSPHATE 4 MG/ML IJ SOLN
INTRAMUSCULAR | Status: AC
  Filled 2016-12-08: qty 1

## 2016-12-08 MED ORDER — KETOROLAC TROMETHAMINE 30 MG/ML IJ SOLN
INTRAMUSCULAR | Status: AC
  Filled 2016-12-08: qty 1

## 2016-12-08 MED ORDER — ROCURONIUM BROMIDE 100 MG/10ML IV SOLN
INTRAVENOUS | Status: DC | PRN
  Administered 2016-12-08 (×2): 10 mg via INTRAVENOUS

## 2016-12-08 MED ORDER — NEOSTIGMINE METHYLSULFATE 10 MG/10ML IV SOLN
INTRAVENOUS | Status: AC
  Filled 2016-12-08: qty 1

## 2016-12-08 MED ORDER — ACETAMINOPHEN 325 MG PO TABS: 650.0000 mg | ORAL_TABLET | Freq: Four times a day (QID) | ORAL | Status: DC | PRN

## 2016-12-08 MED ORDER — SODIUM CHLORIDE 0.9 % IV SOLN
Freq: Once | INTRAVENOUS | Status: AC
  Administered 2016-12-08: 150 mL/h via INTRAVENOUS

## 2016-12-08 MED ORDER — MORPHINE SULFATE (PF) 4 MG/ML IV SOLN
4.0000 mg | Freq: Once | INTRAVENOUS | Status: AC
  Administered 2016-12-08: 4 mg via INTRAVENOUS
  Filled 2016-12-08: qty 1

## 2016-12-08 MED ORDER — ONDANSETRON 4 MG PO TBDP: 4.0000 mg | ORAL_TABLET | Freq: Four times a day (QID) | ORAL | Status: DC | PRN

## 2016-12-08 MED ORDER — ROCURONIUM BROMIDE 50 MG/5ML IV SOLN
INTRAVENOUS | Status: AC
  Filled 2016-12-08: qty 1

## 2016-12-08 MED ORDER — MIDAZOLAM HCL 5 MG/5ML IJ SOLN
INTRAMUSCULAR | Status: DC | PRN
  Administered 2016-12-08: 2 mg via INTRAVENOUS

## 2016-12-08 MED ORDER — LIDOCAINE HCL (CARDIAC) 20 MG/ML IV SOLN
INTRAVENOUS | Status: DC | PRN
  Administered 2016-12-08: 30 mg via INTRAVENOUS

## 2016-12-08 MED ORDER — ARTIFICIAL TEARS OPHTHALMIC OINT
TOPICAL_OINTMENT | OPHTHALMIC | Status: AC
  Filled 2016-12-08: qty 3.5

## 2016-12-08 MED ORDER — FENTANYL CITRATE (PF) 250 MCG/5ML IJ SOLN
INTRAMUSCULAR | Status: AC
  Filled 2016-12-08: qty 5

## 2016-12-08 MED ORDER — PROPOFOL 10 MG/ML IV BOLUS
INTRAVENOUS | Status: AC
  Filled 2016-12-08: qty 40

## 2016-12-08 MED ORDER — LIDOCAINE HCL (PF) 1 % IJ SOLN
INTRAMUSCULAR | Status: AC
  Filled 2016-12-08: qty 5

## 2016-12-08 MED ORDER — GLYCOPYRROLATE 0.2 MG/ML IJ SOLN
INTRAMUSCULAR | Status: DC | PRN
  Administered 2016-12-08: 0.6 mg via INTRAVENOUS

## 2016-12-08 MED ORDER — BUPIVACAINE LIPOSOME 1.3 % IJ SUSP
INTRAMUSCULAR | Status: AC
Start: 2016-12-08 — End: 2016-12-08
  Filled 2016-12-08: qty 20

## 2016-12-08 MED ORDER — PHENYLEPHRINE 40 MCG/ML (10ML) SYRINGE FOR IV PUSH (FOR BLOOD PRESSURE SUPPORT)
PREFILLED_SYRINGE | INTRAVENOUS | Status: AC
  Filled 2016-12-08: qty 10

## 2016-12-08 MED ORDER — PIPERACILLIN-TAZOBACTAM 3.375 G IVPB 30 MIN
3.3750 g | Freq: Once | INTRAVENOUS | Status: AC
  Administered 2016-12-08: 3.375 g via INTRAVENOUS
  Filled 2016-12-08: qty 50

## 2016-12-08 MED ORDER — SIMETHICONE 80 MG PO CHEW
40.0000 mg | CHEWABLE_TABLET | Freq: Four times a day (QID) | ORAL | Status: DC | PRN
  Administered 2016-12-09 (×2): 40 mg via ORAL
  Filled 2016-12-08 (×2): qty 1

## 2016-12-08 MED ORDER — PROPOFOL 10 MG/ML IV BOLUS
INTRAVENOUS | Status: DC | PRN
  Administered 2016-12-08: 100 mg via INTRAVENOUS

## 2016-12-08 MED ORDER — NEOSTIGMINE METHYLSULFATE 10 MG/10ML IV SOLN
INTRAVENOUS | Status: DC | PRN
  Administered 2016-12-08: 4 mg via INTRAVENOUS

## 2016-12-08 MED ORDER — LACTATED RINGERS IV SOLN
INTRAVENOUS | Status: DC | PRN
  Administered 2016-12-08: 18:00:00 via INTRAVENOUS

## 2016-12-08 MED ORDER — EPHEDRINE SULFATE 50 MG/ML IJ SOLN
INTRAMUSCULAR | Status: AC
  Filled 2016-12-08: qty 1

## 2016-12-08 MED ORDER — ATENOLOL 25 MG PO TABS
50.0000 mg | ORAL_TABLET | Freq: Every day | ORAL | Status: DC
  Administered 2016-12-08 – 2016-12-12 (×5): 50 mg via ORAL
  Filled 2016-12-08 (×6): qty 2

## 2016-12-08 MED ORDER — ONDANSETRON HCL 4 MG/2ML IJ SOLN
4.0000 mg | Freq: Once | INTRAMUSCULAR | Status: AC
  Administered 2016-12-08: 4 mg via INTRAVENOUS
  Filled 2016-12-08: qty 2

## 2016-12-08 MED ORDER — FENTANYL CITRATE (PF) 100 MCG/2ML IJ SOLN
INTRAMUSCULAR | Status: DC | PRN
  Administered 2016-12-08 (×3): 50 ug via INTRAVENOUS

## 2016-12-08 MED ORDER — ONDANSETRON HCL 4 MG/2ML IJ SOLN
4.0000 mg | Freq: Four times a day (QID) | INTRAMUSCULAR | Status: DC | PRN
  Administered 2016-12-09 – 2016-12-10 (×2): 4 mg via INTRAVENOUS
  Filled 2016-12-08 (×2): qty 2

## 2016-12-08 SURGICAL SUPPLY — 61 items
APPLIER CLIP 11 MED OPEN (CLIP)
APPLIER CLIP 13 LRG OPEN (CLIP)
APR CLP LRG 13 20 CLIP (CLIP)
APR CLP MED 11 20 MLT OPN (CLIP)
BAG HAMPER (MISCELLANEOUS) ×3 IMPLANT
BARRIER SKIN 2 3/4 (OSTOMY) IMPLANT
BARRIER SKIN 2 3/4 INCH (OSTOMY)
CELLS DAT CNTRL 66122 CELL SVR (MISCELLANEOUS) ×1 IMPLANT
CHLORAPREP W/TINT 26ML (MISCELLANEOUS) ×3 IMPLANT
CLAMP POUCH DRAINAGE QUIET (OSTOMY) IMPLANT
CLIP APPLIE 11 MED OPEN (CLIP) IMPLANT
CLIP APPLIE 13 LRG OPEN (CLIP) IMPLANT
CLOTH BEACON ORANGE TIMEOUT ST (SAFETY) ×3 IMPLANT
COVER LIGHT HANDLE STERIS (MISCELLANEOUS) ×6 IMPLANT
DRAPE WARM FLUID 44X44 (DRAPE) ×3 IMPLANT
DRSG OPSITE POSTOP 4X10 (GAUZE/BANDAGES/DRESSINGS) ×3 IMPLANT
DRSG OPSITE POSTOP 4X6 (GAUZE/BANDAGES/DRESSINGS) ×3 IMPLANT
ELECT BLADE 6 FLAT ULTRCLN (ELECTRODE) IMPLANT
ELECT REM PT RETURN 9FT ADLT (ELECTROSURGICAL) ×3
ELECTRODE REM PT RTRN 9FT ADLT (ELECTROSURGICAL) ×1 IMPLANT
GAUZE SPONGE 4X4 12PLY STRL (GAUZE/BANDAGES/DRESSINGS) ×3 IMPLANT
GLOVE BIOGEL PI IND STRL 7.0 (GLOVE) ×2 IMPLANT
GLOVE BIOGEL PI INDICATOR 7.0 (GLOVE) ×4
GLOVE SURG SS PI 7.5 STRL IVOR (GLOVE) ×3 IMPLANT
GOWN STRL REUS W/TWL LRG LVL3 (GOWN DISPOSABLE) ×9 IMPLANT
HANDLE SUCTION POOLE (INSTRUMENTS) ×1 IMPLANT
INST SET MAJOR GENERAL (KITS) ×3 IMPLANT
KIT REMOVER STAPLE SKIN (MISCELLANEOUS) IMPLANT
KIT ROOM TURNOVER APOR (KITS) ×3 IMPLANT
LIGASURE IMPACT 36 18CM CVD LR (INSTRUMENTS) ×3 IMPLANT
MANIFOLD NEPTUNE II (INSTRUMENTS) ×3 IMPLANT
NEEDLE HYPO 18GX1.5 BLUNT FILL (NEEDLE) ×3 IMPLANT
NS IRRIG 1000ML POUR BTL (IV SOLUTION) ×6 IMPLANT
PACK ABDOMINAL MAJOR (CUSTOM PROCEDURE TRAY) ×3 IMPLANT
PAD ARMBOARD 7.5X6 YLW CONV (MISCELLANEOUS) ×3 IMPLANT
POUCH OSTOMY 2 3/4  H 3804 (WOUND CARE)
POUCH OSTOMY 2 3/4 H 3804 (WOUND CARE)
POUCH OSTOMY 2 PC DRNBL 2.75 (WOUND CARE) IMPLANT
RELOAD LINEAR CUT PROX 55 BLUE (ENDOMECHANICALS) IMPLANT
RELOAD PROXIMATE 75MM BLUE (ENDOMECHANICALS) IMPLANT
RETRACTOR WND ALEXIS 25 LRG (MISCELLANEOUS) IMPLANT
RTRCTR WOUND ALEXIS 18CM MED (MISCELLANEOUS) ×3
RTRCTR WOUND ALEXIS 25CM LRG (MISCELLANEOUS)
SET BASIN LINEN APH (SET/KITS/TRAYS/PACK) ×3 IMPLANT
SPONGE LAP 18X18 X RAY DECT (DISPOSABLE) ×3 IMPLANT
STAPLER GUN LINEAR PROX 60 (STAPLE) IMPLANT
STAPLER PROXIMATE 55 BLUE (STAPLE) IMPLANT
STAPLER PROXIMATE 75MM BLUE (STAPLE) IMPLANT
STAPLER VISISTAT (STAPLE) ×3 IMPLANT
SUCTION POOLE HANDLE (INSTRUMENTS) ×3
SUT CHROMIC 0 SH (SUTURE) IMPLANT
SUT CHROMIC 2 0 SH (SUTURE) IMPLANT
SUT CHROMIC 3 0 SH 27 (SUTURE) ×3 IMPLANT
SUT NOVA NAB GS-26 0 60 (SUTURE) ×6 IMPLANT
SUT PDS AB 0 CTX 60 (SUTURE) IMPLANT
SUT PROLENE 2 0 SH 30 (SUTURE) ×3 IMPLANT
SUT SILK 2 0 (SUTURE) ×3
SUT SILK 2-0 18XBRD TIE 12 (SUTURE) ×1 IMPLANT
SUT SILK 3 0 SH CR/8 (SUTURE) IMPLANT
SYR 20CC LL (SYRINGE) ×3 IMPLANT
TRAY FOLEY CATH SILVER 16FR (SET/KITS/TRAYS/PACK) ×3 IMPLANT

## 2016-12-08 NOTE — Anesthesia Postprocedure Evaluation (Signed)
Anesthesia Post Note  Patient: Erika Diaz  Procedure(s) Performed: Procedure(s) (LRB): EXPLORATORY LAPAROTOMY, PARTIAL COLECTOMY WITH COLOSTOMY (N/A)  Patient location during evaluation: PACU Anesthesia Type: General Level of consciousness: awake and alert, oriented and patient cooperative Pain management: pain level controlled Vital Signs Assessment: post-procedure vital signs reviewed and stable Respiratory status: spontaneous breathing and respiratory function stable Cardiovascular status: stable Anesthetic complications: no     Last Vitals:  Vitals:   12/08/16 1630 12/08/16 1934  BP: (!) 141/61 (!) 152/69  Pulse: 68   Resp: 14 14  Temp:  36.4 C    Last Pain:  Vitals:   12/08/16 1934  PainSc: Asleep                 ADAMS, AMY A

## 2016-12-08 NOTE — Transfer of Care (Signed)
Immediate Anesthesia Transfer of Care Note  Patient: Erika Diaz  Procedure(s) Performed: Procedure(s): EXPLORATORY LAPAROTOMY, PARTIAL COLECTOMY WITH COLOSTOMY (N/A)  Patient Location: PACU  Anesthesia Type:General  Level of Consciousness: awake, oriented and patient cooperative  Airway & Oxygen Therapy: Patient Spontanous Breathing and Patient connected to face mask oxygen  Post-op Assessment: Report given to RN and Post -op Vital signs reviewed and stable  Post vital signs: Reviewed and stable  Last Vitals:  Vitals:   12/08/16 1600 12/08/16 1630  BP: (!) 124/106 (!) 141/61  Pulse: 94 68  Resp: (!) 24 14    Last Pain:  Vitals:   12/08/16 1132  PainSc: 10-Worst pain ever         Complications: No apparent anesthesia complications

## 2016-12-08 NOTE — Anesthesia Preprocedure Evaluation (Addendum)
Anesthesia Evaluation  Patient identified by MRN, date of birth, ID band Patient awake    Reviewed: Allergy & Precautions, NPO status   Airway Mallampati: II  TM Distance: >3 FB Neck ROM: Full    Dental  (+) Poor Dentition, Dental Advisory Given,    Pulmonary former smoker,    breath sounds clear to auscultation       Cardiovascular hypertension, Pt. on medications  Rhythm:Regular Rate:Normal     Neuro/Psych Anxiety    GI/Hepatic Perforated bowel; nausea,vomiting   Endo/Other    Renal/GU      Musculoskeletal   Abdominal   Peds  Hematology  (+) anemia ,   Anesthesia Other Findings   Reproductive/Obstetrics                            Anesthesia Physical Anesthesia Plan  ASA: III and emergent  Anesthesia Plan: General   Post-op Pain Management:    Induction: Intravenous, Rapid sequence and Cricoid pressure planned  PONV Risk Score and Plan: 3 and Ondansetron, Dexamethasone, Propofol and Midazolam  Airway Management Planned: Oral ETT  Additional Equipment:   Intra-op Plan:   Post-operative Plan: Extubation in OR  Informed Consent:   Plan Discussed with:   Anesthesia Plan Comments:        Anesthesia Quick Evaluation

## 2016-12-08 NOTE — ED Provider Notes (Signed)
[facility] DEPT Provider Note   CSN: [id number] Arrival date & time: 12/08/16  1132     History   Chief Complaint Chief Complaint  Patient presents with  . Abdominal Pain    HPI Erika Diaz is a 71 y.o. female with a past medical history significant for anemia and HTN who presents today complaining multiple episodes of emesis. Patient reports that symptoms started on Friday with lower abdominal pain and multiple episodes of NBNB emesis. Patient had her last BM 5 days ago. This morning patient start having green emesis which was concerning to her daughter who decided to bring her to the ED. Patient has not tolerated any po since friday. Patient denies fever, chills, CP, SOB. Patient has a history of bloody BM two months ago and was scheduled for a colonoscopy in August. Patient endorse weight loss recently and decrease appetite.  HPI  Past Medical History:  Diagnosis Date  . Anemia   . Anxiety   . Hypertension   . Vitamin D deficiency     Patient Active Problem List   Diagnosis Date Noted  . Fracture of femoral neck ([place]) 01/02/2014  . Hip fracture ([place]) 11/28/2013  . Closed right hip fracture ([place]) 11/27/2013  . Femur fracture, right ([place]) 11/27/2013  . Alcohol abuse 11/27/2013  . Hyponatremia 11/27/2013  . HTN (hypertension) 10/11/2013  . Anemia 10/11/2013    Past Surgical History:  Procedure Laterality Date  . BREAST LUMPECTOMY Right    lump removal   . HIP ARTHROPLASTY Right 11/28/2013   Procedure: PARTIAL RIGHT HIP REPLACEMENT (BIPOLAR);  Surgeon: Carole Civil, MD;  Location: [facility];  Service: Orthopedics;  Laterality: Right;    OB History    No data available       Home Medications    Prior to Admission medications   Medication Sig Start Date End Date Taking? Authorizing Provider  atenolol (TENORMIN) 50 MG tablet Take 1 tablet (50 mg total) by mouth daily. 08/23/14  Yes Chipper Herb, MD  bismuth subsalicylate (PEPTO BISMOL) 262 MG/15ML  suspension Take 30 mLs by mouth every 6 (six) hours as needed.   Yes [provider]    Family History Family History  Problem Relation Age of Onset  . Diabetes Mother   . Stomach cancer Mother     Social History Social History  Substance Use Topics  . Smoking status: Former Smoker    Quit date: 05/26/2004  . Smokeless tobacco: Never Used  . Alcohol use Yes     Comment: Beer occ.- 2 per day     Allergies   Patient has no known allergies.   Review of Systems Review of Systems  Constitutional: Positive for appetite change and unexpected weight change.  HENT: Negative.   Eyes: Negative.   Respiratory: Negative.   Cardiovascular: Negative.   Gastrointestinal: Positive for abdominal pain and vomiting.  Endocrine: Negative.   Genitourinary: Negative.      Physical Exam Updated Vital Signs BP (!) 109/94   Pulse 83   Resp 20   Ht 5\' 4"  (1.626 m)   Wt 56.7 kg (125 lb)   SpO2 95%   BMI 21.46 kg/m   Physical Exam  Constitutional: She is oriented to person, place, and time. She appears well-developed.  HENT:  Head: Normocephalic and atraumatic.  Eyes: Pupils are equal, round, and reactive to light. Conjunctivae and EOM are normal.  Neck: Normal range of motion. Neck supple.  Cardiovascular: Normal rate, regular rhythm and normal  heart sounds.   Pulmonary/Chest: Effort normal and breath sounds normal.  Abdominal: Soft. Bowel sounds are normal. There is tenderness.  RLQ and LLQ tender to palpation, no rebound tenderness, of guarding noted during exam. Hyperactive bowel sounds  Musculoskeletal: Normal range of motion.  Neurological: She is alert and oriented to person, place, and time.  Skin: Skin is warm and dry.  Psychiatric: She has a normal mood and affect. Her behavior is normal.     ED Treatments / Results  Labs (all labs ordered are listed, but only abnormal results are displayed) Labs Reviewed  COMPREHENSIVE METABOLIC PANEL - Abnormal; Notable for  the following:       Result Value   Sodium 130 (*)    Chloride 90 (*)    Glucose, Bld 122 (*)    BUN 25 (*)    Creatinine, Ser 1.28 (*)    Albumin 3.0 (*)    ALT 12 (*)    GFR calc non Af Amer 41 (*)    GFR calc Af Amer 48 (*)    All other components within normal limits  CBC - Abnormal; Notable for the following:    WBC 19.1 (*)    Hemoglobin 10.1 (*)    HCT 31.2 (*)    MCV 74.5 (*)    MCH 24.1 (*)    RDW 16.2 (*)    Platelets 494 (*)    All other components within normal limits  LIPASE, BLOOD  URINALYSIS, ROUTINE W REFLEX MICROSCOPIC    EKG  EKG Interpretation None       Radiology Ct Abdomen Pelvis W Contrast  Result Date: 12/08/2016 CLINICAL DATA:  Abdominal pain and bilious emesis. EXAM: CT ABDOMEN AND PELVIS WITH CONTRAST TECHNIQUE: Multidetector CT imaging of the abdomen and pelvis was performed using the standard protocol following bolus administration of intravenous contrast. CONTRAST:  119mL ISOVUE-300 IOPAMIDOL (ISOVUE-300) INJECTION 61% COMPARISON:  CT abdomen pelvis 11/03/2007 FINDINGS: Lower chest: Bibasilar subsegmental atelectasis. Hepatobiliary: Normal hepatic size and contours without focal liver lesion. No perihepatic ascites. No intra- or extrahepatic biliary dilatation. Normal gallbladder. Pancreas: Normal pancreatic contours and enhancement. No peripancreatic fluid collection or pancreatic ductal dilatation. Spleen: Normal. Adrenals/Urinary Tract: Unchanged subcentimeter right adrenal nodule. Bilateral lobulated renal contours without focal mass. Stomach/Bowel: There is clustered small bowel in the pelvis with surrounding free air and mild adjacent edema. The stomach is distended. There are multiple loops of dilated jejunum. There is swirling of the mesenteric vasculature. In the central abdomen, there is a gas and fluid collection that measures approximately 4.4 x 4.0 cm. There are multiple other smaller areas of free fluid. Multifocal free air in the anterior  abdomen. Vascular/Lymphatic: There is atherosclerotic calcification of the non aneurysmal abdominal aorta. No abdominal or pelvic adenopathy. Reproductive: Status post hysterectomy. Musculoskeletal: Right total hip arthroplasty. No acute abnormality. Normal visualized extrathoracic and extraperitoneal soft tissues. Other: No contributory non-categorized findings. IMPRESSION: 1. Clustered small bowel in the upper pelvis with associated dilated proximal small bowel extending to the proximal jejunum in association with twisted appearance of the mesenteric vasculature and mesenteric edema is most consistent with closed loop small-bowel obstruction. 2. Pneumoperitoneum and multiple areas of free fluid indicating perforation. 4 cm gas and fluid collection in the central abdomen. 3.  Aortic Atherosclerosis (ICD10-I70.0). Critical Value/emergent results were called by telephone at the time of interpretation on 12/08/2016 at 2:54 pm to Dr. Elnora Morrison , who verbally acknowledged these results. Electronically Signed   By: Cletus Gash.D.  On: 12/08/2016 14:58    Procedures Procedures (including critical care time)  Medications Ordered in ED Medications  piperacillin-tazobactam (ZOSYN) IVPB 3.375 g (3.375 g Intravenous New Bag/Given 12/08/16 1532)  ondansetron (ZOFRAN) injection 4 mg (4 mg Intravenous Given 12/08/16 1338)  sodium chloride 0.9 % bolus 1,000 mL (0 mLs Intravenous Stopped 12/08/16 1535)  morphine 4 MG/ML injection 4 mg (4 mg Intravenous Given 12/08/16 1338)  iopamidol (ISOVUE-300) 61 % injection 100 mL (100 mLs Intravenous Contrast Given 12/08/16 1418)  0.9 %  sodium chloride infusion (150 mL/hr Intravenous New Bag/Given 12/08/16 1531)     Initial Impression / Assessment and Plan / ED Course  Patient presented with severe lower abdominal pain for the past 72 hours with bilious emesis.  CBC showed Leukocytosis or 19.1 with some anorexia, poor po intake, sign of constipation initially concerning  appendicitis. Abdominal CT reveal small bowel obstruction, pneumoperitoneum and free fluid and gas consitstent with perforation. General Surgery was consulted and patient will most likely require surgical intervention. Patient was started on Zosyn and received IV Fluid. EKG and CXR were ordered for preop. I have reviewed the triage vital signs and the nursing notes.  Pertinent labs & imaging results that were available during my care of the patient were reviewed by me and considered in my medical decision making (see chart for details).   Final Clinical Impressions(s) / ED Diagnoses   Final diagnoses:  Small bowel obstruction ([place])  Pneumoperitoneum  Bowel perforation [facility])    New Prescriptions New Prescriptions   No medications on file     Marjie Skiff, MD 12/08/16 1631    Elnora Morrison, MD 12/10/16 [phone]

## 2016-12-08 NOTE — ED Notes (Signed)
Patient vomiting yellow bile at this time. Paged Dr Arnoldo Morale.

## 2016-12-08 NOTE — H&P (Signed)
Erika Diaz is an 71 y.o. female.   Chief Complaint: Abdominal pain HPI: Patient is a 71 year old white female who presented in the emergency room with a three-day history of worsening nonspecific abdominal pain. She states that was hurting in the lower part of her abdomen. The pain was 8 out of 10 when she moved. CT scan of the abdomen revealed pneumoperitoneum with intra-abdominal fluid collection. There was also question of an internal hernia. She was scheduled to have a colonoscopy next month due to intermittent blood per rectum.  Past Medical History:  Diagnosis Date  . Anemia   . Anxiety   . Hypertension   . Vitamin D deficiency     Past Surgical History:  Procedure Laterality Date  . BREAST LUMPECTOMY Right    lump removal   . HIP ARTHROPLASTY Right 11/28/2013   Procedure: PARTIAL RIGHT HIP REPLACEMENT (BIPOLAR);  Surgeon: Carole Civil, MD;  Location: [facility];  Service: Orthopedics;  Laterality: Right;    Family History  Problem Relation Age of Onset  . Diabetes Mother   . Stomach cancer Mother    Social History:  reports that she quit smoking about 12 years ago. She has never used smokeless tobacco. She reports that she drinks alcohol. She reports that she does not use drugs.  Allergies: No Known Allergies   (Not in a hospital admission)  Results for orders placed or performed during the hospital encounter of 12/08/16 (from the past 48 hour(s))  Urinalysis, Routine w reflex microscopic     Status: Abnormal   Collection Time: 12/08/16 11:37 AM  Result Value Ref Range   Color, Urine YELLOW YELLOW   APPearance HAZY (A) CLEAR   Specific Gravity, Urine 1.043 (H) 1.005 - 1.030   pH 5.0 5.0 - 8.0   Glucose, UA NEGATIVE NEGATIVE mg/dL   Hgb urine dipstick NEGATIVE NEGATIVE   Bilirubin Urine NEGATIVE NEGATIVE   Ketones, ur 5 (A) NEGATIVE mg/dL   Protein, ur NEGATIVE NEGATIVE mg/dL   Nitrite NEGATIVE NEGATIVE   Leukocytes, UA NEGATIVE NEGATIVE  Lipase, blood      Status: None   Collection Time: 12/08/16 11:50 AM  Result Value Ref Range   Lipase 19 11 - 51 U/L  Comprehensive metabolic panel     Status: Abnormal   Collection Time: 12/08/16 11:50 AM  Result Value Ref Range   Sodium 130 (L) 135 - 145 mmol/L   Potassium 4.1 3.5 - 5.1 mmol/L   Chloride 90 (L) 101 - 111 mmol/L   CO2 27 22 - 32 mmol/L   Glucose, Bld 122 (H) 65 - 99 mg/dL   BUN 25 (H) 6 - 20 mg/dL   Creatinine, Ser 1.28 (H) 0.44 - 1.00 mg/dL   Calcium 9.2 8.9 - 10.3 mg/dL   Total Protein 7.4 6.5 - 8.1 g/dL   Albumin 3.0 (L) 3.5 - 5.0 g/dL   AST 24 15 - 41 U/L   ALT 12 (L) 14 - 54 U/L   Alkaline Phosphatase 65 38 - 126 U/L   Total Bilirubin 0.7 0.3 - 1.2 mg/dL   GFR calc non Af Amer 41 (L) >60 mL/min   GFR calc Af Amer 48 (L) >60 mL/min    Comment: (NOTE) The eGFR has been calculated using the CKD EPI equation. This calculation has not been validated in all clinical situations. eGFR's persistently <60 mL/min signify possible Chronic Kidney Disease.    Anion gap 13 5 - 15  CBC     Status:  Abnormal   Collection Time: 12/08/16 11:50 AM  Result Value Ref Range   WBC 19.1 (H) 4.0 - 10.5 K/uL   RBC 4.19 3.87 - 5.11 MIL/uL   Hemoglobin 10.1 (L) 12.0 - 15.0 g/dL   HCT 31.2 (L) 36.0 - 46.0 %   MCV 74.5 (L) 78.0 - 100.0 fL   MCH 24.1 (L) 26.0 - 34.0 pg   MCHC 32.4 30.0 - 36.0 g/dL   RDW 16.2 (H) 11.5 - 15.5 %   Platelets 494 (H) 150 - 400 K/uL   Ct Abdomen Pelvis W Contrast  Result Date: 12/08/2016 CLINICAL DATA:  Abdominal pain and bilious emesis. EXAM: CT ABDOMEN AND PELVIS WITH CONTRAST TECHNIQUE: Multidetector CT imaging of the abdomen and pelvis was performed using the standard protocol following bolus administration of intravenous contrast. CONTRAST:  110m ISOVUE-300 IOPAMIDOL (ISOVUE-300) INJECTION 61% COMPARISON:  CT abdomen pelvis 11/03/2007 FINDINGS: Lower chest: Bibasilar subsegmental atelectasis. Hepatobiliary: Normal hepatic size and contours without focal liver lesion.  No perihepatic ascites. No intra- or extrahepatic biliary dilatation. Normal gallbladder. Pancreas: Normal pancreatic contours and enhancement. No peripancreatic fluid collection or pancreatic ductal dilatation. Spleen: Normal. Adrenals/Urinary Tract: Unchanged subcentimeter right adrenal nodule. Bilateral lobulated renal contours without focal mass. Stomach/Bowel: There is clustered small bowel in the pelvis with surrounding free air and mild adjacent edema. The stomach is distended. There are multiple loops of dilated jejunum. There is swirling of the mesenteric vasculature. In the central abdomen, there is a gas and fluid collection that measures approximately 4.4 x 4.0 cm. There are multiple other smaller areas of free fluid. Multifocal free air in the anterior abdomen. Vascular/Lymphatic: There is atherosclerotic calcification of the non aneurysmal abdominal aorta. No abdominal or pelvic adenopathy. Reproductive: Status post hysterectomy. Musculoskeletal: Right total hip arthroplasty. No acute abnormality. Normal visualized extrathoracic and extraperitoneal soft tissues. Other: No contributory non-categorized findings. IMPRESSION: 1. Clustered small bowel in the upper pelvis with associated dilated proximal small bowel extending to the proximal jejunum in association with twisted appearance of the mesenteric vasculature and mesenteric edema is most consistent with closed loop small-bowel obstruction. 2. Pneumoperitoneum and multiple areas of free fluid indicating perforation. 4 cm gas and fluid collection in the central abdomen. 3.  Aortic Atherosclerosis (ICD10-I70.0). Critical Value/emergent results were called by telephone at the time of interpretation on 12/08/2016 at 2:54 pm to Dr. JElnora Morrison, who verbally acknowledged these results. Electronically Signed   By: KUlyses JarredM.D.   On: 12/08/2016 14:58   Dg Chest Portable 1 View  Result Date: 12/08/2016 CLINICAL DATA:  Right lower quadrant abdominal  pain over the last 3 days. EXAM: PORTABLE CHEST 1 VIEW COMPARISON:  11/27/2013 FINDINGS: Heart size is at the upper limits of normal. Mediastinal shadows are normal. Markings are slightly prominent other lung bases, possibly due to atelectasis. The stomach appears to be gas filled. No free air seen under the right hemidiaphragm. No acute bone finding. IMPRESSION: Slightly prominent markings at the lung bases, most likely secondary to poor inspiration. Electronically Signed   By: MNelson ChimesM.D.   On: 12/08/2016 16:08    Review of Systems  Constitutional: Positive for malaise/fatigue.  HENT: Negative.   Eyes: Negative.   Cardiovascular: Negative.   Gastrointestinal: Positive for abdominal pain and nausea.  Genitourinary: Negative.   Musculoskeletal: Negative.   Skin: Negative.   Neurological: Negative.   Endo/Heme/Allergies: Negative.   Psychiatric/Behavioral: Negative.     Blood pressure (!) 141/61, pulse 68, resp. rate 14, height 5'  4" (1.626 m), weight 125 lb (56.7 kg), SpO2 96 %. Physical Exam  Vitals reviewed. Constitutional: She is oriented to person, place, and time. She appears well-developed and well-nourished. No distress.  HENT:  Head: Normocephalic and atraumatic.  Neck: Normal range of motion. Neck supple.  Cardiovascular: Normal rate, regular rhythm and normal heart sounds.   No murmur heard. Respiratory: Effort normal and breath sounds normal. She has no wheezes. She has no rales.  GI: There is tenderness. There is rebound and guarding.  Rigid abdomen.  Neurological: She is alert and oriented to person, place, and time.  Skin: Skin is warm and dry.    CT scan images personally reviewed Assessment/Plan Impression: Perforated viscus with peritonitis Plan: Patient will be taken emergently to the operating room for exploratory laparotomy. The risks and benefits of the procedure including bleeding, infection, the possibility of bowel resection, and the possibility of a  colostomy were fully explained to the patient, who gave informed consent.  Aviva Signs, MD 12/08/2016, 4:53 PM

## 2016-12-08 NOTE — Op Note (Signed)
Patient:  Erika Diaz  DOB:  1945/11/19  MRN:  [id number]   Preop Diagnosis:  Perforated viscus  Postop Diagnosis:  Perforated sigmoid colon  Procedure:  Exploratory laparotomy, Hartman's procedure  Surgeon:  Aviva Signs, M.D.  Anes:  Gen. endotracheal  Indications:  Patient is a 71 year old white female who presents with a three-day history of worsening abdominal pain. CT scan of the abdomen reveals pneumoperitoneum. Patient has peritonitis on physical examination. The patient is being brought emergently to the operating room for exploratory laparotomy. The risks and benefits of the procedure including bleeding, infection, bowel resection, and the possibility of a colostomy were fully explained to the patient, who gave informed consent.  Procedure note:  The patient was placed the supine position. After induction of general endotracheal anesthesia, the abdomen was prepped and draped using the usual sterile technique with DuraPrep. Surgical site confirmation was performed.  A midline incision was made from the umbilicus to the suprapubic region. The peritoneal cavity was entered into without difficulty. Upon entering the abdomen, purulent fluid was noted within the pelvis. Aerobic and anaerobic cultures were taken and sent to microbiology. The small bowel was then exteriorized and inspected. All the purulent fluid seen to be emanating from the sigmoid colon. The sigmoid colon was notably inflamed. There was also one area of narrowing in the mid sigmoid colon. This is concerning for a malignancy. I was able to find the source of the perforation. The sigmoid colon had perforated into the mesentery was emanating from the mesentery of the sigmoid colon. The sigmoid colon was mobilized along its peritoneal reflection. Care was taken to avoid the left ureter. As the patient had gross purulent fluid within the pelvis, I elected to proceed with a partial colectomy with colostomy. A TA 60 stapler was  placed along the distal sigmoid colon and fired. This was likewise done along the distal descending colon. The mesentery of the sigmoid colon was then divided using the LigaSure. A suture was placed proximally on the sigmoid colon for orientation purposes. The specimen was then removed from the operative field. A colostomy was then formed using a cruciate incision into the abdominal wall to the left of the umbilicus. The colon was then brought out through this ostomy. The abdominal cavity was then copiously irrigated with normal saline. Multiple liters of fluid were used. A 2-0 Prolene suture was placed along the remaining rectal stump. The bowel was returned into the abdominal cavity in an orderly fashion. The liver was palpated and no masses were noted. The uterus was within normal limits. The fallopian tube and ovaries appeared age-appropriate. The fascia was reapproximated using a looped 0 PDS running suture. Subcutaneous layer was irrigated with normal saline. External was instilled into the surrounding wound. The skin was closed using staples. Betadine ointment and dry sterile dressing were applied. The ostomy was then matured using 3-0 chromic gut interrupted sutures. An ostomy appliance was then applied.  All tape and needle counts were correct at the end of the procedure. The patient was extubated in the operating room and transferred to PACU in stable condition.  Complications:  None  EBL:  50 mL  Specimen:  Sigmoid colon, suture proximal

## 2016-12-08 NOTE — Anesthesia Procedure Notes (Signed)
Procedure Name: Intubation Date/Time: 12/08/2016 5:37 PM Performed by: Andree Elk, AMY A Pre-anesthesia Checklist: Patient identified, Patient being monitored, Timeout performed, Emergency Drugs available and Suction available Patient Re-evaluated:Patient Re-evaluated prior to induction Oxygen Delivery Method: Circle System Utilized Preoxygenation: Pre-oxygenation with 100% oxygen Induction Type: IV induction, Cricoid Pressure applied and Rapid sequence Laryngoscope Size: Mac and 3 Grade View: Grade I Tube type: Oral Tube size: 7.0 mm Number of attempts: 1 Airway Equipment and Method: Stylet Placement Confirmation: ETT inserted through vocal cords under direct vision,  positive ETCO2 and breath sounds checked- equal and bilateral Secured at: 21 cm Tube secured with: Tape Dental Injury: Teeth and Oropharynx as per pre-operative assessment

## 2016-12-08 NOTE — ED Triage Notes (Addendum)
Pt reports RLQ abd pain x3 days. Per EMS, vomiting started this am. nad noted. LBM this am.

## 2016-12-09 LAB — BASIC METABOLIC PANEL
Anion gap: 9 (ref 5–15)
BUN: 23 mg/dL — ABNORMAL HIGH (ref 6–20)
CALCIUM: 7.8 mg/dL — AB (ref 8.9–10.3)
CO2: 24 mmol/L (ref 22–32)
CREATININE: 0.86 mg/dL (ref 0.44–1.00)
Chloride: 99 mmol/L — ABNORMAL LOW (ref 101–111)
GFR calc non Af Amer: >60 mL/min (ref >60)
Glucose, Bld: 101 mg/dL — ABNORMAL HIGH (ref 65–99)
Potassium: 4 mmol/L (ref 3.5–5.1)
SODIUM: 132 mmol/L — AB (ref 135–145)

## 2016-12-09 LAB — CBC
HEMATOCRIT: 24.6 % — AB (ref 36.0–46.0)
Hemoglobin: 8 g/dL — ABNORMAL LOW (ref 12.0–15.0)
MCH: 24.6 pg — ABNORMAL LOW (ref 26.0–34.0)
MCHC: 32.5 g/dL (ref 30.0–36.0)
MCV: 75.7 fL — ABNORMAL LOW (ref 78.0–100.0)
Platelets: 400 10*3/uL (ref 150–400)
RBC: 3.25 MIL/uL — ABNORMAL LOW (ref 3.87–5.11)
RDW: 16.4 % — AB (ref 11.5–15.5)
WBC: 17 10*3/uL — ABNORMAL HIGH (ref 4.0–10.5)

## 2016-12-09 LAB — PHOSPHORUS: PHOSPHORUS: 3.7 mg/dL (ref 2.5–4.6)

## 2016-12-09 LAB — MAGNESIUM: MAGNESIUM: 1.9 mg/dL (ref 1.7–2.4)

## 2016-12-09 MED ORDER — ALUM & MAG HYDROXIDE-SIMETH 200-200-20 MG/5ML PO SUSP
30.0000 mL | Freq: Four times a day (QID) | ORAL | Status: DC | PRN
  Administered 2016-12-10 (×2): 30 mL via ORAL
  Filled 2016-12-09 (×3): qty 30

## 2016-12-09 MED ORDER — PROMETHAZINE HCL 25 MG/ML IJ SOLN
12.5000 mg | Freq: Four times a day (QID) | INTRAMUSCULAR | Status: DC | PRN
  Administered 2016-12-09 – 2016-12-10 (×2): 12.5 mg via INTRAVENOUS
  Filled 2016-12-09 (×2): qty 1

## 2016-12-09 MED ORDER — PANTOPRAZOLE SODIUM 40 MG PO TBEC
40.0000 mg | DELAYED_RELEASE_TABLET | Freq: Every day | ORAL | Status: DC
  Administered 2016-12-10 – 2016-12-12 (×3): 40 mg via ORAL
  Filled 2016-12-09 (×3): qty 1

## 2016-12-09 NOTE — Progress Notes (Signed)
1 Day Post-Op  Subjective: Patient states she feels much better today. Mild incisional pain.  Objective: Vital Diaz in last 24 hours: Temp:  [97.5 F (36.4 C)-98.6 F (37 C)] 97.5 F (36.4 C) (07/17 0521) Pulse Rate:  [60-94] 60 (07/17 0521) Resp:  [13-24] 16 (07/17 0521) BP: (102-152)/(56-106) 141/73 (07/17 0521) SpO2:  [93 %-100 %] 100 % (07/17 0521) Weight:  [120 lb 13 oz (54.8 kg)-125 lb (56.7 kg)] 120 lb 13 oz (54.8 kg) (07/16 2032) Last BM Date: 12/08/16  Intake/Output from previous day: 07/16 0701 - 07/17 0700 In: 3426.7 [P.O.:50; I.V.:2326.7; IV Piggyback:1050] Out: 535 [Urine:170; Stool:15; Blood:50] Intake/Output this shift: No intake/output data recorded.  General appearance: alert, cooperative and no distress Resp: clear to auscultation bilaterally Cardio: regular rate and rhythm, S1, S2 normal, no murmur, click, rub or gallop GI: Soft, incision healing well. Ostomy pink and patent.  Lab Results:   Recent Labs  12/08/16 1150 12/09/16 0414  WBC 19.1* 17.0*  HGB 10.1* 8.0*  HCT 31.2* 24.6*  PLT 494* 400   BMET  Recent Labs  12/08/16 1150 12/09/16 0414  NA 130* 132*  K 4.1 4.0  CL 90* 99*  CO2 27 24  GLUCOSE 122* 101*  BUN 25* 23*  CREATININE 1.28* 0.86  CALCIUM 9.2 7.8*   PT/INR No results for input(s): LABPROT, INR in the last 72 hours.  Studies/Results: Ct Abdomen Pelvis W Contrast  Result Date: 12/08/2016 CLINICAL DATA:  Abdominal pain and bilious emesis. EXAM: CT ABDOMEN AND PELVIS WITH CONTRAST TECHNIQUE: Multidetector CT imaging of the abdomen and pelvis was performed using the standard protocol following bolus administration of intravenous contrast. CONTRAST:  146mL ISOVUE-300 IOPAMIDOL (ISOVUE-300) INJECTION 61% COMPARISON:  CT abdomen pelvis 11/03/2007 FINDINGS: Lower chest: Bibasilar subsegmental atelectasis. Hepatobiliary: Normal hepatic size and contours without focal liver lesion. No perihepatic ascites. No intra- or extrahepatic  biliary dilatation. Normal gallbladder. Pancreas: Normal pancreatic contours and enhancement. No peripancreatic fluid collection or pancreatic ductal dilatation. Spleen: Normal. Adrenals/Urinary Tract: Unchanged subcentimeter right adrenal nodule. Bilateral lobulated renal contours without focal mass. Stomach/Bowel: There is clustered small bowel in the pelvis with surrounding free air and mild adjacent edema. The stomach is distended. There are multiple loops of dilated jejunum. There is swirling of the mesenteric vasculature. In the central abdomen, there is a gas and fluid collection that measures approximately 4.4 x 4.0 cm. There are multiple other smaller areas of free fluid. Multifocal free air in the anterior abdomen. Vascular/Lymphatic: There is atherosclerotic calcification of the non aneurysmal abdominal aorta. No abdominal or pelvic adenopathy. Reproductive: Status post hysterectomy. Musculoskeletal: Right total hip arthroplasty. No acute abnormality. Normal visualized extrathoracic and extraperitoneal soft tissues. Other: No contributory non-categorized findings. IMPRESSION: 1. Clustered small bowel in the upper pelvis with associated dilated proximal small bowel extending to the proximal jejunum in association with twisted appearance of the mesenteric vasculature and mesenteric edema is most consistent with closed loop small-bowel obstruction. 2. Pneumoperitoneum and multiple areas of free fluid indicating perforation. 4 cm gas and fluid collection in the central abdomen. 3.  Aortic Atherosclerosis (ICD10-I70.0). Critical Value/emergent results were called by telephone at the time of interpretation on 12/08/2016 at 2:54 pm to Dr. Elnora Morrison , who verbally acknowledged these results. Electronically Signed   By: Ulyses Jarred M.D.   On: 12/08/2016 14:58   Dg Chest Portable 1 View  Result Date: 12/08/2016 CLINICAL DATA:  Right lower quadrant abdominal pain over the last 3 days. EXAM: PORTABLE CHEST 1  VIEW COMPARISON:  11/27/2013 FINDINGS: Heart size is at the upper limits of normal. Mediastinal shadows are normal. Markings are slightly prominent other lung bases, possibly due to atelectasis. The stomach appears to be gas filled. No free air seen under the right hemidiaphragm. No acute bone finding. IMPRESSION: Slightly prominent markings at the lung bases, most likely secondary to poor inspiration. Electronically Signed   By: Nelson Chimes M.D.   On: 12/08/2016 16:08    Anti-infectives: Anti-infectives    Start     Dose/Rate Route Frequency Ordered Stop   12/09/16 0000  piperacillin-tazobactam (ZOSYN) IVPB 3.375 g     3.375 g 12.5 mL/hr over 240 Minutes Intravenous Every 8 hours 12/08/16 1914     12/08/16 1500  piperacillin-tazobactam (ZOSYN) IVPB 3.375 g     3.375 g 100 mL/hr over 30 Minutes Intravenous  Once 12/08/16 1456 12/08/16 1634      Assessment/Plan: s/p Procedure(s): EXPLORATORY LAPAROTOMY, PARTIAL COLECTOMY WITH COLOSTOMY Impression: Stable on postoperative day 1. We will adjust IV fluids. Anemia secondary to acute on chronic blood loss. No need for transfusion.  LOS: 1 day    Erika Diaz 12/09/2016

## 2016-12-09 NOTE — Progress Notes (Signed)
**Note De-Identified  Obfuscation** IS instruction given; patient tolerated fairly well with moderate effort.  She needs encouragement to continue use.   RRT to continue to monitor

## 2016-12-09 NOTE — Consult Note (Signed)
[facility] Nurse ostomy consult note Stoma type/location: LLQ, end colostomy Stomal assessment/size: 1 3/8" slightly oval, flush with the skin, pale but moist Peristomal assessment: pouch intact from OR Treatment options for stomal/peristomal skin: may need 2" ostomy barrier ring for flush stoma Output none, bloody Ostomy pouching: 2pc.  Education provided:  Met with patient, patient's husband and patient's daughter. Daughter is a respiratory therapist in [place].  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  Explained role of the ostomy nurse, explained creation of stoma/surgical procedure, discussed pouching options (1pc vs 2pc), discussed ADLs, and other activities with ostomy. Pouch change frequency and need for emptying.  She is quite nauseated during the end part of my visit. I was able to have patient, husband and daughter, and son-in-law look at stoma and took 2pc apart to show stoma appearance to them. Explained s/s to look for with ostomy and skin changes. demonstrated lock and roll closure and how to remove gas from the pouch. Will see patient Wednesday and Thursday with daughter present to complete pouch change with patient is feeling  Better.  Enrolled patient in [place] program: No, had consent form signed.  Will enroll once decision on pouching system needed.   [place] Nurse will follow along with you for continued support with ostomy teaching and care [place] MSN, RN, [place], [place], [place]

## 2016-12-09 NOTE — Anesthesia Postprocedure Evaluation (Signed)
Anesthesia Post Note  Patient: Erika Diaz  Procedure(s) Performed: Procedure(s) (LRB): EXPLORATORY LAPAROTOMY, PARTIAL COLECTOMY WITH COLOSTOMY (N/A)  Patient location during evaluation: Nursing Unit Anesthesia Type: General Level of consciousness: awake and alert and oriented Pain management: pain level controlled Vital Signs Assessment: post-procedure vital signs reviewed and stable Respiratory status: spontaneous breathing Cardiovascular status: blood pressure returned to baseline and stable Postop Assessment: no signs of nausea or vomiting Anesthetic complications: no     Last Vitals:  Vitals:   12/09/16 0030 12/09/16 0521  BP: (!) 132/58 (!) 141/73  Pulse:  60  Resp:  16  Temp: 37 C (!) 36.4 C    Last Pain:  Vitals:   12/09/16 0521  TempSrc: Oral  PainSc:                  [name]

## 2016-12-09 NOTE — Consult Note (Signed)
WOC contacted bedside nurse to arrange teaching visit today with patient and any family. Patient will have her daughter come between 1-2 pm for visit with [facility] nurse.  [place], [place], [place]

## 2016-12-09 NOTE — Addendum Note (Signed)
Addendum  created 12/09/16 0751 by Ollen Bowl, CRNA   Sign clinical note

## 2016-12-10 ENCOUNTER — Encounter (HOSPITAL_COMMUNITY): Payer: Self-pay | Admitting: General Surgery

## 2016-12-10 LAB — CBC
HEMATOCRIT: 24.3 % — AB (ref 36.0–46.0)
Hemoglobin: 7.8 g/dL — ABNORMAL LOW (ref 12.0–15.0)
MCH: 24.3 pg — ABNORMAL LOW (ref 26.0–34.0)
MCHC: 32.1 g/dL (ref 30.0–36.0)
MCV: 75.7 fL — ABNORMAL LOW (ref 78.0–100.0)
Platelets: 421 10*3/uL — ABNORMAL HIGH (ref 150–400)
RBC: 3.21 MIL/uL — ABNORMAL LOW (ref 3.87–5.11)
RDW: 15.7 % — ABNORMAL HIGH (ref 11.5–15.5)
WBC: 12.9 10*3/uL — AB (ref 4.0–10.5)

## 2016-12-10 LAB — BASIC METABOLIC PANEL
ANION GAP: 8 (ref 5–15)
BUN: 21 mg/dL — ABNORMAL HIGH (ref 6–20)
CALCIUM: 8.5 mg/dL — AB (ref 8.9–10.3)
CO2: 27 mmol/L (ref 22–32)
CREATININE: 0.81 mg/dL (ref 0.44–1.00)
Chloride: 95 mmol/L — ABNORMAL LOW (ref 101–111)
Glucose, Bld: 104 mg/dL — ABNORMAL HIGH (ref 65–99)
Potassium: 4.2 mmol/L (ref 3.5–5.1)
SODIUM: 130 mmol/L — AB (ref 135–145)

## 2016-12-10 LAB — PHOSPHORUS: PHOSPHORUS: 3.3 mg/dL (ref 2.5–4.6)

## 2016-12-10 LAB — CEA: CEA: 3.2 ng/mL (ref 0.0–4.7)

## 2016-12-10 LAB — MAGNESIUM: MAGNESIUM: 2.1 mg/dL (ref 1.7–2.4)

## 2016-12-10 MED ORDER — POLYETHYLENE GLYCOL 3350 17 G PO PACK
17.0000 g | PACK | Freq: Every day | ORAL | Status: DC
  Administered 2016-12-10 – 2016-12-11 (×2): 17 g via ORAL
  Filled 2016-12-10 (×3): qty 1

## 2016-12-10 MED ORDER — METOCLOPRAMIDE HCL 5 MG/ML IJ SOLN
5.0000 mg | Freq: Three times a day (TID) | INTRAMUSCULAR | Status: DC
Start: 2016-12-10 — End: 2016-12-12
  Administered 2016-12-10 – 2016-12-12 (×6): 5 mg via INTRAVENOUS
  Filled 2016-12-10 (×6): qty 2

## 2016-12-10 NOTE — Consult Note (Signed)
[facility] Nurse ostomy follow up Stoma type/location: LLQ, end colostomy Stomal assessment/size: 1 3/8" x 1 1/2" slightly oval shaped, pink, moist  Peristomal assessment: intact  Treatment options for stomal/peristomal skin:using 2" flat barrier to aid in seal with flush stoma Output bloody, no flatus, no stool Ostomy pouching: 1pc with 2" barrier ring Education provided:  Pouch change demonstrated to patient's husband and daughter. Demonstrated measuring stoma, cutting wafer, placement of barrier ring around the stoma. Placement of the pouch. Patient practiced lock and roll closure several times. Explained using toilet paper wick to clean the bottom of the spout after emptying and prior to closure. Husband does ask the same questions multiple time. I will have daughter practice cutting new pouch tomorrow. Plans to work with patient's son on Friday.  Enrolled patient in [place] Start Discharge program: Yes  [place] Nurse will follow along with you for continued support with ostomy teaching and care [place] MSN, [place], [place], [place], [place]

## 2016-12-10 NOTE — Progress Notes (Signed)
2 Days Post-Op  Subjective: Patient with moderate incisional pain. Still with some nausea.  Objective: Vital signs in last 24 hours: Temp:  [98.2 F (36.8 C)-98.6 F (37 C)] 98.2 F (36.8 C) (07/18 0537) Pulse Rate:  [51-63] 63 (07/18 0537) Resp:  [16-20] 16 (07/18 0537) BP: (132-147)/(31-80) 145/59 (07/18 0537) SpO2:  [93 %-100 %] 94 % (07/18 0537) Last BM Date: 12/09/16  Intake/Output from previous day: 07/17 0701 - 07/18 0700 In: 1252.5 [P.O.:480; I.V.:722.5; IV Piggyback:50] Out: 420 [Urine:350; Stool:70] Intake/Output this shift: Total I/O In: 150 [IV Piggyback:150] Out: -   General appearance: alert, cooperative and no distress Resp: clear to auscultation bilaterally Cardio: regular rate and rhythm, S1, S2 normal, no murmur, click, rub or gallop GI: Soft. Incision healing well. Ostomy pink and patent.  Lab Results:   Recent Labs  12/09/16 0414 12/10/16 0419  WBC 17.0* 12.9*  HGB 8.0* 7.8*  HCT 24.6* 24.3*  PLT 400 421*   BMET  Recent Labs  12/09/16 0414 12/10/16 0419  NA 132* 130*  K 4.0 4.2  CL 99* 95*  CO2 24 27  GLUCOSE 101* 104*  BUN 23* 21*  CREATININE 0.86 0.81  CALCIUM 7.8* 8.5*   PT/INR No results for input(s): LABPROT, INR in the last 72 hours.  Studies/Results: Ct Abdomen Pelvis W Contrast  Result Date: 12/08/2016 CLINICAL DATA:  Abdominal pain and bilious emesis. EXAM: CT ABDOMEN AND PELVIS WITH CONTRAST TECHNIQUE: Multidetector CT imaging of the abdomen and pelvis was performed using the standard protocol following bolus administration of intravenous contrast. CONTRAST:  172mL ISOVUE-300 IOPAMIDOL (ISOVUE-300) INJECTION 61% COMPARISON:  CT abdomen pelvis 11/03/2007 FINDINGS: Lower chest: Bibasilar subsegmental atelectasis. Hepatobiliary: Normal hepatic size and contours without focal liver lesion. No perihepatic ascites. No intra- or extrahepatic biliary dilatation. Normal gallbladder. Pancreas: Normal pancreatic contours and  enhancement. No peripancreatic fluid collection or pancreatic ductal dilatation. Spleen: Normal. Adrenals/Urinary Tract: Unchanged subcentimeter right adrenal nodule. Bilateral lobulated renal contours without focal mass. Stomach/Bowel: There is clustered small bowel in the pelvis with surrounding free air and mild adjacent edema. The stomach is distended. There are multiple loops of dilated jejunum. There is swirling of the mesenteric vasculature. In the central abdomen, there is a gas and fluid collection that measures approximately 4.4 x 4.0 cm. There are multiple other smaller areas of free fluid. Multifocal free air in the anterior abdomen. Vascular/Lymphatic: There is atherosclerotic calcification of the non aneurysmal abdominal aorta. No abdominal or pelvic adenopathy. Reproductive: Status post hysterectomy. Musculoskeletal: Right total hip arthroplasty. No acute abnormality. Normal visualized extrathoracic and extraperitoneal soft tissues. Other: No contributory non-categorized findings. IMPRESSION: 1. Clustered small bowel in the upper pelvis with associated dilated proximal small bowel extending to the proximal jejunum in association with twisted appearance of the mesenteric vasculature and mesenteric edema is most consistent with closed loop small-bowel obstruction. 2. Pneumoperitoneum and multiple areas of free fluid indicating perforation. 4 cm gas and fluid collection in the central abdomen. 3.  Aortic Atherosclerosis (ICD10-I70.0). Critical Value/emergent results were called by telephone at the time of interpretation on 12/08/2016 at 2:54 pm to Dr. Elnora Morrison , who verbally acknowledged these results. Electronically Signed   By: Ulyses Jarred M.D.   On: 12/08/2016 14:58   Dg Chest Portable 1 View  Result Date: 12/08/2016 CLINICAL DATA:  Right lower quadrant abdominal pain over the last 3 days. EXAM: PORTABLE CHEST 1 VIEW COMPARISON:  11/27/2013 FINDINGS: Heart size is at the upper limits of  normal. Mediastinal shadows  are normal. Markings are slightly prominent other lung bases, possibly due to atelectasis. The stomach appears to be gas filled. No free air seen under the right hemidiaphragm. No acute bone finding. IMPRESSION: Slightly prominent markings at the lung bases, most likely secondary to poor inspiration. Electronically Signed   By: Nelson Chimes M.D.   On: 12/08/2016 16:08    Anti-infectives: Anti-infectives    Start     Dose/Rate Route Frequency Ordered Stop   12/09/16 0000  piperacillin-tazobactam (ZOSYN) IVPB 3.375 g     3.375 g 12.5 mL/hr over 240 Minutes Intravenous Every 8 hours 12/08/16 1914     12/08/16 1500  piperacillin-tazobactam (ZOSYN) IVPB 3.375 g     3.375 g 100 mL/hr over 30 Minutes Intravenous  Once 12/08/16 1456 12/08/16 1634      Assessment/Plan: s/p Procedure(s): EXPLORATORY LAPAROTOMY, PARTIAL COLECTOMY WITH COLOSTOMY Impression: Stable on postoperative day 2. Awaiting return of bowel function. Will start ambulating patient.  LOS: 2 days    [place] Signs 12/10/2016

## 2016-12-10 NOTE — Progress Notes (Signed)
At [phone] Foley removed, pt tolerated well.

## 2016-12-11 LAB — CBC
HCT: 27.9 % — ABNORMAL LOW (ref 36.0–46.0)
HEMOGLOBIN: 8.8 g/dL — AB (ref 12.0–15.0)
MCH: 23.8 pg — AB (ref 26.0–34.0)
MCHC: 31.5 g/dL (ref 30.0–36.0)
MCV: 75.4 fL — ABNORMAL LOW (ref 78.0–100.0)
PLATELETS: 434 10*3/uL — AB (ref 150–400)
RBC: 3.7 MIL/uL — ABNORMAL LOW (ref 3.87–5.11)
RDW: 15.9 % — ABNORMAL HIGH (ref 11.5–15.5)
WBC: 7.4 10*3/uL (ref 4.0–10.5)

## 2016-12-11 LAB — BASIC METABOLIC PANEL
Anion gap: 9 (ref 5–15)
BUN: 15 mg/dL (ref 6–20)
CALCIUM: 8.3 mg/dL — AB (ref 8.9–10.3)
CO2: 29 mmol/L (ref 22–32)
CREATININE: 0.88 mg/dL (ref 0.44–1.00)
Chloride: 94 mmol/L — ABNORMAL LOW (ref 101–111)
GFR calc Af Amer: >60 mL/min (ref >60)
GFR calc non Af Amer: >60 mL/min (ref >60)
GLUCOSE: 95 mg/dL (ref 65–99)
Potassium: 4.3 mmol/L (ref 3.5–5.1)
Sodium: 132 mmol/L — ABNORMAL LOW (ref 135–145)

## 2016-12-11 NOTE — Progress Notes (Signed)
3 Days Post-Op  Subjective: Patient is now having ostomy output.  Objective: Vital signs in last 24 hours: Temp:  [98.3 F (36.8 C)-99.4 F (37.4 C)] 99.4 F (37.4 C) (07/19 0418) Pulse Rate:  [69-73] 73 (07/19 0418) Resp:  [18] 18 (07/19 0418) BP: (148-151)/(56-63) 151/63 (07/19 0418) SpO2:  [93 %-97 %] 94 % (07/19 0418) Last BM Date: 12/09/16  Intake/Output from previous day: 07/18 0701 - 07/19 0700 In: 2096.7 [I.V.:1946.7; IV Piggyback:150] Out: -  Intake/Output this shift: No intake/output data recorded.  General appearance: alert, cooperative and no distress Resp: clear to auscultation bilaterally Cardio: regular rate and rhythm, S1, S2 normal, no murmur, click, rub or gallop GI: Soft, incision healing well. Gas and stool in ostomy bag. Ostomy pink.  Lab Results:   Recent Labs  12/10/16 0419 12/11/16 0407  WBC 12.9* 7.4  HGB 7.8* 8.8*  HCT 24.3* 27.9*  PLT 421* 434*   BMET  Recent Labs  12/10/16 0419 12/11/16 0407  NA 130* 132*  K 4.2 4.3  CL 95* 94*  CO2 27 29  GLUCOSE 104* 95  BUN 21* 15  CREATININE 0.81 0.88  CALCIUM 8.5* 8.3*   PT/INR No results for input(s): LABPROT, INR in the last 72 hours.  Studies/Results: No results found.  Anti-infectives: Anti-infectives    Start     Dose/Rate Route Frequency Ordered Stop   12/09/16 0000  piperacillin-tazobactam (ZOSYN) IVPB 3.375 g     3.375 g 12.5 mL/hr over 240 Minutes Intravenous Every 8 hours 12/08/16 1914     12/08/16 1500  piperacillin-tazobactam (ZOSYN) IVPB 3.375 g     3.375 g 100 mL/hr over 30 Minutes Intravenous  Once 12/08/16 1456 12/08/16 1634      Assessment/Plan: s/p Procedure(s): EXPLORATORY LAPAROTOMY, PARTIAL COLECTOMY WITH COLOSTOMY Impression: Postoperative day 3. Patient's bowel function is returning. Will advance to regular diet. Continue IV Zosyn. Final pathology pending.  LOS: 3 days    Aviva Signs 12/11/2016

## 2016-12-11 NOTE — Consult Note (Signed)
[facility] Nurse ostomy follow up  Today when I arrived the patient's family would really like to try to use two piece. They fell they can manage this at home better, however it should be noted patient has a flush stoma and it appears it will be in an area with abdominal contour issues (crease).  I have placed patient in 1pc with barrier ring for that reason to accommodate these issus and be flexible for the contour issues.  They are persistent to try the 2 pc.and I agreed to switch to 2pc today while inpatient that way we can see if she has issues. I will send both flat 1pc and 2pc home with patient just in case. They are concerned about DC to home, I have added HHRN for them. I feel Dr. Arnoldo Morale will agree with this plan.  Stoma type/location: LLQ, end colostomy Stomal assessment/size: 1 3/8" x 1 1/2" oval, pink, moist Peristomal assessment: intact  Treatment options for stomal/peristomal skin: using 2" barrier ring for flush stoma  Output brown, loose  Ostomy pouching: /2pc.  Education provided:  Patient's daughter cut new wafer, and removed old pouch but had to step away due to odor and gagging, she was able to return to the bedside after [facility] nurse cleaned the skin and stool and took old pouch away.  She finished the pouch change, applied barrier ring to the skin with minimal cuing and applied wafer and pouch correctly.  Patients husband and son are at the bedside, I do not know if they will be able to assist with care. Patient is much more engaged today to participate as well.  Education provided on reason patient still passing mucous via her rectum as well.  Enrolled patient in [place] Start Discharge program: Yes  [facility] Nurse will follow along with you for continued support with ostomy teaching and care [place] MSN, [place], [place], [place], [place]

## 2016-12-12 LAB — CBC
HEMATOCRIT: 26.1 % — AB (ref 36.0–46.0)
Hemoglobin: 8.5 g/dL — ABNORMAL LOW (ref 12.0–15.0)
MCH: 24.4 pg — AB (ref 26.0–34.0)
MCHC: 32.6 g/dL (ref 30.0–36.0)
MCV: 75 fL — AB (ref 78.0–100.0)
Platelets: 363 10*3/uL (ref 150–400)
RBC: 3.48 MIL/uL — ABNORMAL LOW (ref 3.87–5.11)
RDW: 16 % — AB (ref 11.5–15.5)
WBC: 8.1 10*3/uL (ref 4.0–10.5)

## 2016-12-12 NOTE — Care Management Note (Addendum)
Case Management Note  Patient Details  Name: Erika Diaz MRN: [id number] Date of Birth: 03/17/1946  Subjective/Objective:   S/p partial colectomy with colostomy. From home with family. Daughter plans to stay with patient some and help out. Family has been working with [facility] nurse.            Action/Plan: Plans to return home with [facility] RN. Would like [facility]. CM will notify Juliann Pulse of [facility]. Patient aware that [facility] has 48 hours to initiate services. [place] [facility] nurse has ordered pouch supplies for patient. MD notified for need of [place] orders.   Expected Discharge Date:       12/12/2016           Expected Discharge Plan:  [place]  In-House Referral:     Discharge planning Services  CM Consult  Post Acute Care Choice:  Home Health Choice offered to:  Patient  DME Arranged:    DME Agency:     HH Arranged:  RN [place] Agency:  [facility]  Status of Service:  Completed, signed off  If discussed at Advance of Stay Meetings, dates discussed:    Additional Comments:  [name], Chauncey [place], RN 12/12/2016, 9:18 AM

## 2016-12-12 NOTE — Consult Note (Signed)
[facility] Nurse ostomy follow up Stoma type/location: LLQ, end colostomy Output liquid brown stool Ostomy pouching: Education provided:  Met with patient and husband Husband drew new pattern on both flat 1pc, and 2pc wafer. He then cut the new wafer/pouch successfully but with some encouragement. Husband verbalized all the steps in the pouch change.  Instructed and updated orders for staff to allow patient and family to empty pouch in the bathroom or graduate over next 24 hours to prepare for DC to home. Son did not arrive for teaching . Daughter is back at work today. HHRN to see patient for continued support with ostomy teaching.  [facility] catalog marked with items needed    Enrolled patient in [employer] Discharge program: Yes  [place] Nurse will follow along with you for continued support with ostomy teaching and care [place] MSN, RN, [place], [place], [place]

## 2016-12-12 NOTE — Care Management Important Message (Signed)
Important Message  Patient Details  Name: Erika Diaz MRN: [id number] Date of Birth: [birth year]   Medicare Important Message Given:  Yes    [place], Chauncey [place], RN 12/12/2016, 3:57 PM

## 2016-12-12 NOTE — Progress Notes (Signed)
4 Days Post-Op  Subjective: Patient started to tolerate regular diet well. Ostomy output increasing.  Objective: Vital signs in last 24 hours: Temp:  [98.6 F (37 C)] 98.6 F (37 C) (07/20 0523) Pulse Rate:  [75-84] 84 (07/20 0523) Resp:  [18] 18 (07/20 0523) BP: (146-153)/(73-78) 153/73 (07/20 0523) SpO2:  [95 %-98 %] 95 % (07/20 0523) Last BM Date: 12/11/16  Intake/Output from previous day: 07/19 0701 - 07/20 0700 In: 287.3 [I.V.:187.3; IV Piggyback:100] Out: -  Intake/Output this shift: Total I/O In: -  Out: 550 [Stool:550]  General appearance: alert, cooperative and no distress Resp: clear to auscultation bilaterally Cardio: regular rate and rhythm, S1, S2 normal, no murmur, click, rub or gallop GI: Soft, incision healing well. Ostomy pink and patent.  Lab Results:   Recent Labs  12/11/16 0407 12/12/16 0050  WBC 7.4 8.1  HGB 8.8* 8.5*  HCT 27.9* 26.1*  PLT 434* 363   BMET  Recent Labs  12/10/16 0419 12/11/16 0407  NA 130* 132*  K 4.2 4.3  CL 95* 94*  CO2 27 29  GLUCOSE 104* 95  BUN 21* 15  CREATININE 0.81 0.88  CALCIUM 8.5* 8.3*   PT/INR No results for input(s): LABPROT, INR in the last 72 hours.  Studies/Results: No results found.  Anti-infectives: Anti-infectives    Start     Dose/Rate Route Frequency Ordered Stop   12/09/16 0000  piperacillin-tazobactam (ZOSYN) IVPB 3.375 g     3.375 g 12.5 mL/hr over 240 Minutes Intravenous Every 8 hours 12/08/16 1914     12/08/16 1500  piperacillin-tazobactam (ZOSYN) IVPB 3.375 g     3.375 g 100 mL/hr over 30 Minutes Intravenous  Once 12/08/16 1456 12/08/16 1634      Assessment/Plan: s/p Procedure(s): EXPLORATORY LAPAROTOMY, PARTIAL COLECTOMY WITH COLOSTOMY Impression: GI function returning, postoperative day 4. Patient's hemoglobin remaining relatively stable. Leukocytosis resolved. Final pathology pending. Anticipate discharge with home health and next 24-48 hours.  LOS: 4 days    Aviva Signs 12/12/2016

## 2016-12-13 LAB — AEROBIC/ANAEROBIC CULTURE W GRAM STAIN (SURGICAL/DEEP WOUND)

## 2016-12-13 LAB — AEROBIC/ANAEROBIC CULTURE (SURGICAL/DEEP WOUND)

## 2016-12-13 MED ORDER — AMOXICILLIN-POT CLAVULANATE 875-125 MG PO TABS: 1.0000 | ORAL_TABLET | Freq: Two times a day (BID) | ORAL | 0 refills | Status: DC

## 2016-12-13 NOTE — Progress Notes (Signed)
Late entry:  1000 pt able to verbalize how to empty her ostomy pouch and effectively demonstrated how to remove gas from it.  Pt and husband both state they are comfortable with all aspects of the ostomy.  Pt's daughter has also been educated in ostomy care but is not present at this time.  Pt has her ostomy supplies and a catalog that has been marked with what she needs to order by the [facility] nurse.  Written discharge instructions, medication list and follow-up appointments have all been reviewed with pt.  She is ready for discharge home.

## 2016-12-13 NOTE — Discharge Summary (Signed)
Physician Discharge Summary  Patient ID: OCIA SIMEK MRN: [id number] DOB/AGE: Feb 02, 1946 71 y.o.  Admit date: 12/08/2016 Discharge date: 12/13/2016  Admission Diagnoses:Perforated viscus, peritonitis  Discharge Diagnoses: Same, perforated colon, colon cancer Active Problems:   Bowel perforation ([place])   S/P partial colectomy   Discharged Condition: good  Hospital Course: Patient is a 71 year old white female who presented to the emergency room with three-day history of worsening abdominal pain. CT scan of the abdomen revealed pneumoperitoneum with intra-abdominal abscess formation. On examination, patient had peritoneal signs. She was taken to the operating room on 12/08/2016 and underwent a Hartman's procedure. She tolerated the surgery well. Her postoperative course was for the most part unremarkable. She was continued on IV Zosyn. Her diet was advanced without difficulty once bowel function returned. She did receive ostomy care instructions. She has been referred to home health for wound check in ostomy care. Final pathology reveals a T3, N1, M0 adenocarcinoma of the colon. Patient and family are aware of the diagnosis. The patient is being discharged home on 12/13/2016 in good and improving condition.  Treatments: surgery: Hartman's procedure on 12/08/2016  Discharge Exam: Blood pressure (!) 144/56, pulse 76, temperature 99.1 F (37.3 C), temperature source Oral, resp. rate 16, height 5\' 4"  (1.626 m), weight 120 lb 13 oz (54.8 kg), SpO2 100 %. General appearance: alert, cooperative and no distress Resp: clear to auscultation bilaterally Cardio: regular rate and rhythm, S1, S2 normal, no murmur, click, rub or gallop GI: Soft, incision healing well. Ostomy pink and patent.  Disposition: 03-Skilled Nursing Facility  Discharge Instructions    Diet - low sodium heart healthy    Complete by:  As directed    Increase activity slowly    Complete by:  As directed      Allergies as of  12/13/2016   No Known Allergies     Medication List    TAKE these medications   amoxicillin-clavulanate 875-125 MG tablet Commonly known as:  AUGMENTIN Take 1 tablet by mouth 2 (two) times daily.   atenolol 50 MG tablet Commonly known as:  TENORMIN Take 1 tablet (50 mg total) by mouth daily.   bismuth subsalicylate 027 XA/12IN suspension Commonly known as:  PEPTO BISMOL Take 30 mLs by mouth every 6 (six) hours as needed.      Follow-up Information    Aviva Signs, MD. Schedule an appointment as soon as possible for a visit on 12/18/2016.   Specialty:  General Surgery Contact information: [id number] [place] [address] [phone]           Signed: Aviva Signs 12/13/2016, 10:00 AM

## 2016-12-13 NOTE — Discharge Instructions (Signed)
Colostomy Home Guide, Adult A colostomy is a surgical procedure to make an opening (stoma) for stool (feces) to leave your body. This surgery is done when a medical condition prevents stool from leaving your body through the end of the large intestine (rectum). During the surgery, part of the large intestine (colon) is attached to the stoma that is made in the front of your abdomen. A bag (pouch) is fitted over the stoma. Stool and gas will collect in the bag. After having this surgery, you will need to empty and change your colostomy bag as needed. You will also need to care for the stoma. How do I care for my stoma? Your stoma should look pink, red, and moist, like the inside of your cheek. At first, the stoma may be swollen, but this swelling will go away within 6 weeks. To care for the stoma:  Keep the skin around the stoma clean and dry.  Use a clean, soft washcloth to gently wash the stoma and the skin around it. ? Use warm water and only use cleansers recommended by your health care provider. ? Rinse the stoma area with plain water. ? Dry the area well.  Use stoma powder or ointment on your skin only as told by your health care provider. Do not use any other powders, gels, wipes, or creams on your skin.  Change your colostomy bag if your skin becomes irritated. Irritation may indicate that the bag is leaking.  Check your stoma area every day for signs of infection. Check for: ? More redness, swelling, or pain. ? More fluid or blood. ? Pus or warmth.  Measure the stoma opening regularly and record the size. Watch for changes. Share this information with your health care provider.  How do I care for my colostomy bag? The bag that fits over the stoma can have either one or two pieces.  One-piece bag: The skin barrier and the bag are combined in a single unit.  Two-piece bag: The skin barrier and the bag are separate pieces that attach to each other. 1.   Empty your bag at bedtime  and whenever it is one-third to one-half full. Do not let more stool or gas build up. This could cause the bag to leak. Some colostomy bags have a built-in gas release valve. Change the bag every 3-4 days or as told by your health care provider. Also change the bag if it is leaking or separating from the skin or your skin looks irritated. How do I empty my colostomy bag? Before you leave the hospital, you will be taught how to empty your bag. Follow these basic steps: 1. Wash your hands with soap and water. 2. Sit far back on the toilet. 3. Put several pieces of toilet paper into the toilet water. This will prevent splashing as you empty the stool into the toilet. 4. Remove the clip or the velcro from the tail end of the bag. 5. Unroll the tail, then empty stool into the toilet. 6. Clean the tail with toilet paper. 1.  7. Reroll the tail, and close it with the clip or velcro. 8. Wash your hands again.  How do I change my colostomy bag? Before you leave the hospital, you will be taught how to change your bag. Always have colostomy supplies with you, and follow these basic steps: 1. Wash your hands with soap and water. Have paper towels or tissues near you to clean any discharge. 2. Use a template to pre-cut  the skin barrier. Smooth any rough edges. 3. If using a two-piece bag, attach the bag and the skin barrier to each other. Add the barrier ring, if you use one. 4. If your stools are watery, add a few cotton balls to the new bag to absorb the liquid. 5. Remove the old bag and skin barrier. Gently push the skin away from the barrier with your fingers or a warm cloth. 6. Wash your hands again. Then clean the stoma area as directed with water or with mild soap and water. Use water to rinse away any soap. 7. Dry the skin. You may use the cool setting on a hair dryer to do this. 8. If directed, apply stoma powder or skin barrier gel to the skin. 9. Dry the skin again. 10. Warm the skin barrier  with your hands or a warm compress. 11. Remove the paper from the sticky (adhesive) strip of the skin barrier. 12. Press the adhesive strip onto the skin around the stoma. 13. Gently rub the skin barrier onto the skin. This creates heat that helps the barrier to stick. 14. Apply stoma tape to the edges of the skin barrier.  What are some general tips?  Avoid wearing clothes that are tight directly over your stoma.  You may shower or bathe with the colostomy bag on or off. Do not use harsh or oily soaps or lotions. Dry the skin and bag after bathing.  Store all supplies in a cool, dry place. Do not leave supplies in extreme heat because parts can melt.  Whenever you leave home, take an extra skin barrier and colostomy bag with you.  If your colostomy bag gets wet, you can dry it with a hair dryer on the cool setting.  To prevent odor, put drops of ostomy deodorizer in the colostomy bag. Your health care provider may also recommend putting ostomy lubricant inside the bag. This helps the stool to slide out of the bag more easily and completely. Contact a health care provider if:  You have more redness, swelling, or pain around your stoma.  You have more fluid or blood coming from your stoma.  Your stoma feels warm to the touch.  You have pus coming from your stoma.  Your stoma extends in or out farther than normal.  You need to change the bag every day.  You have a fever. Get help right away if:  Your stool is bloody.  You vomit.  You have trouble breathing. This information is not intended to replace advice given to you by your health care provider. Make sure you discuss any questions you have with your health care provider. Document Released: 05/15/2003 Document Revised: 09/20/2015 Document Reviewed: 09/14/2013 Elsevier Interactive Patient Education  2018 [place], Adult, Care After Refer to this sheet in the next few weeks. These instructions provide you  with information about caring for yourself after your procedure. Your health care provider may also give you more specific instructions. Your treatment has been planned according to current medical practices, but problems sometimes occur. Call your health care provider if you have any problems or questions after your procedure. What can I expect after the procedure? After the procedure, it is common to have:  Swelling at the opening that was created during the procedure (stoma).  Slight bleeding around the stoma.  Redness around the stoma.  Follow these instructions at home: Activity  Rest as needed while the stoma area heals.  Return to your normal activities  as told by your health care provider. Ask your health care provider what activities are safe for you.  Avoid strenuous activity and abdominal exercises for 3 weeks or for as long as told by your health care provider.  Do not lift anything that is heavier than 10 lb (4.5 kg). Incision care   Follow instructions from your health care provider about how to take care of your incision. Make sure you: ? Wash your hands with soap and water before you change your bandage (dressing). If soap and water are not available, use hand sanitizer. ? Change your dressing as told by your health care provider. ? Leave stitches (sutures), skin glue, or adhesive strips in place. These skin closures may need to stay in place for 2 weeks or longer. If adhesive strip edges start to loosen and curl up, you may trim the loose edges. Do not remove adhesive strips completely unless your health care provider tells you to do that. Stoma Care  Keep the stoma area clean.  Clean and dry the skin around the stoma each time you change the colostomy bag. To clean the stoma area: ? Use warm water and only use cleansers that are recommended by your health care provider. ? Rinse the stoma area with plain water. ? Dry the area well.  Use stoma powder or ointment on  your skin only as told by your health care provider. Do not use any other powders, gels, wipes, or creams on your skin.  Check the stoma area every day for signs of infection. Check for: ? More redness, swelling, or pain. ? More fluid or blood. ? Pus or warmth.  Measure the stoma opening regularly and record the size. Watch for changes. Share this information with your health care provider. Bathing  Do not take baths, swim, or use a hot tub until your health care provider approves. Ask your health care provider if you can take showers. You may be able to shower with or without the colostomy bag in place. If you bathe with the bag on, dry the bag afterward.  Avoid using harsh or oily soaps when you bathe. Colostomy Bag Care  Follow instructions from your health care provider about how to empty or change the colostomy bag.  Keep colostomy supplies with you at all times.  Store all supplies in a cool, dry place.  Empty the colostomy bag: ? Whenever it is one-third to one-half full. ? At bedtime.  Replace the bag every 2-4 days or as told by your health care provider. Driving  Do not drive for 24 hours if you received a sedative.  Do not drive or operate heavy machinery while taking prescription pain medicine. General instructions  Follow instructions from your health care provider about eating or drinking restrictions.  Take over-the-counter and prescription medicines only as told by your health care provider.  Avoid wearing clothes that are tight directly over your stoma.  Do not use any tobacco products, such as cigarettes, chewing tobacco, and e-cigarettes. If you need help quitting, ask your health care provider.  (Women) Ask your health care provider about becoming pregnant and about using birth control. Medicines may not be absorbed normally after the procedure.  Keep all follow-up visits as told by your health care provider. This is important. Contact a health care  provider if:  You are having trouble caring for your stoma or changing the colostomy bag.  You feel nauseous or you vomit.  You have a fever.  You  havemore redness, swelling, or pain at the site of your stoma or around your anus.  You have more fluid or blood coming from your stoma or your anus.  Your stoma area feels warm to the touch.  You have pus coming from your stoma.  You notice a change in the size or appearance of the stoma.  You have abdominal pain, bloating, pressure, or cramping.  Your have stool more often or less often than your health care provider tells you to expect.  You are not making much urine. This may be a sign of dehydration. Get help right away if:  Your abdominal pain does not go away or it becomes severe.  You keep vomiting.  Your stool is not draining through the stoma.  You have chest pain or an irregular heartbeat. This information is not intended to replace advice given to you by your health care provider. Make sure you discuss any questions you have with your health care provider. Document Released: 10/02/2010 Document Revised: 09/20/2015 Document Reviewed: 01/23/2015 Elsevier Interactive Patient Education  2018 [place]. Open Colectomy, Care After This sheet gives you information about how to care for yourself after your procedure. Your health care provider may also give you more specific instructions. If you have problems or questions, contact your health care provider. What can I expect after the procedure? After the procedure, it is common to have:  Pain in your abdomen, especially along your incision.  Tiredness. Your energy level will return to normal over the next several weeks.  Constipation.  Nausea.  Difficulty urinating.  Follow these instructions at home: Activity  You may be able to return to most of your normal activities within 1-2 weeks, such as working, walking up stairs, and sexual activity.  Avoid  activities that require a lot of energy for 4-6 weeks after surgery, such as running, climbing, and lifting heavy objects. Ask your health care provider what activities are safe for you.  Take rest breaks during the day as needed.  Do not drive for 1-2 weeks or until your health care provider says that it is safe.  Do not drive or use heavy machinery while taking prescription pain medicines.  Do not lift anything that is heavier than 10 lb (4.3 kg) until your health care provider says that it is safe. Incision care  Follow instructions from your health care provider about how to take care of your incision. Make sure you: ? Wash your hands with soap and water before you change your bandage (dressing). If soap and water are not available, use hand sanitizer. ? Change your dressing as told by your health care provider. ? Leave stitches (sutures) or staples in place. These skin closures may need to stay in place for 2 weeks or longer.  Avoid wearing tight clothing around your incision.  Protect your incision area from the sun.  Check your incision area every day for signs of infection. Check for: ? More redness, swelling, or pain. ? More fluid or blood. ? Warmth. ? Pus or a bad smell. General instructions  Do not take baths, swim, or use a hot tub until your health care provider approves. Ask your health care provider when you may shower.  Take over-the-counter and prescription medicines, including stool softeners, only as told by your health care provider.  Eat a low-fat and low-fiber diet for the first 4 weeks after surgery.  Keep all follow-up visits as told by your health care provider. This is important. Contact  a health care provider if:  You have more redness, swelling, or pain around your incision.  You have more fluid or blood coming from your incision.  Your incision feels warm to the touch.  You have pus or a bad smell coming from your incision.  You have a fever or  chills.  You do not have a bowel movement 2-3 days after surgery.  You cannot eat or drink for 24 hours or more.  You have persistent nausea and vomiting.  You have abdominal pain that gets worse and does not get better with medicine. Get help right away if:  You have chest pain.  You have shortness of breath.  You have pain or swelling in your legs.  Your incision breaks open after your sutures or staples have been removed.  You have bleeding from the rectum. This information is not intended to replace advice given to you by your health care provider. Make sure you discuss any questions you have with your health care provider. Document Released: 12/03/2010 Document Revised: 02/11/2016 Document Reviewed: 02/11/2016 Elsevier Interactive Patient Education  [employer].

## 2016-12-18 ENCOUNTER — Emergency Department (HOSPITAL_COMMUNITY): Payer: Medicare HMO

## 2016-12-18 ENCOUNTER — Ambulatory Visit: Payer: Commercial Managed Care - HMO | Admitting: General Surgery

## 2016-12-18 ENCOUNTER — Inpatient Hospital Stay (HOSPITAL_COMMUNITY)
Admission: EM | Admit: 2016-12-18 | Discharge: 2016-12-22 | DRG: 862 | Disposition: A | Discharge status: Home Health | Payer: Medicare HMO | Attending: General Surgery | Admitting: General Surgery

## 2016-12-18 ENCOUNTER — Encounter (HOSPITAL_COMMUNITY): Payer: Self-pay | Admitting: Emergency Medicine

## 2016-12-18 DIAGNOSIS — Z9049 Acquired absence of other specified parts of digestive tract: Secondary | ICD-10-CM | POA: Diagnosis not present

## 2016-12-18 DIAGNOSIS — C189 Malignant neoplasm of colon, unspecified: Secondary | ICD-10-CM | POA: Diagnosis present

## 2016-12-18 DIAGNOSIS — Z800 Family history of malignant neoplasm of digestive organs: Secondary | ICD-10-CM

## 2016-12-18 DIAGNOSIS — R1030 Lower abdominal pain, unspecified: Secondary | ICD-10-CM | POA: Diagnosis present

## 2016-12-18 DIAGNOSIS — Z87891 Personal history of nicotine dependence: Secondary | ICD-10-CM

## 2016-12-18 DIAGNOSIS — Z933 Colostomy status: Secondary | ICD-10-CM

## 2016-12-18 DIAGNOSIS — Y848 Other medical procedures as the cause of abnormal reaction of the patient, or of later complication, without mention of misadventure at the time of the procedure: Secondary | ICD-10-CM | POA: Diagnosis present

## 2016-12-18 DIAGNOSIS — E876 Hypokalemia: Secondary | ICD-10-CM | POA: Diagnosis present

## 2016-12-18 DIAGNOSIS — B9620 Unspecified Escherichia coli [E. coli] as the cause of diseases classified elsewhere: Secondary | ICD-10-CM | POA: Diagnosis present

## 2016-12-18 DIAGNOSIS — T814XXA Infection following a procedure, initial encounter: Secondary | ICD-10-CM | POA: Diagnosis present

## 2016-12-18 DIAGNOSIS — K651 Peritoneal abscess: Secondary | ICD-10-CM | POA: Diagnosis present

## 2016-12-18 DIAGNOSIS — T8149XA Infection following a procedure, other surgical site, initial encounter: Secondary | ICD-10-CM

## 2016-12-18 HISTORY — DX: Malignant neoplasm of colon, unspecified: C18.9

## 2016-12-18 LAB — URINALYSIS, ROUTINE W REFLEX MICROSCOPIC
BILIRUBIN URINE: NEGATIVE
Bacteria, UA: NONE SEEN
GLUCOSE, UA: NEGATIVE mg/dL
HGB URINE DIPSTICK: NEGATIVE
KETONES UR: NEGATIVE mg/dL
NITRITE: NEGATIVE
PH: 5 (ref 5.0–8.0)
PROTEIN: NEGATIVE mg/dL
Specific Gravity, Urine: 1.041 — ABNORMAL HIGH (ref 1.005–1.030)

## 2016-12-18 LAB — COMPREHENSIVE METABOLIC PANEL
ALBUMIN: 2.5 g/dL — AB (ref 3.5–5.0)
ALK PHOS: 40 U/L (ref 38–126)
ALT: 12 U/L — AB (ref 14–54)
AST: 17 U/L (ref 15–41)
Anion gap: 10 (ref 5–15)
BUN: 5 mg/dL — ABNORMAL LOW (ref 6–20)
CO2: 24 mmol/L (ref 22–32)
Calcium: 8.2 mg/dL — ABNORMAL LOW (ref 8.9–10.3)
Chloride: 95 mmol/L — ABNORMAL LOW (ref 101–111)
Creatinine, Ser: 0.67 mg/dL (ref 0.44–1.00)
GFR calc Af Amer: >60 mL/min (ref >60)
GFR calc non Af Amer: >60 mL/min (ref >60)
GLUCOSE: 111 mg/dL — AB (ref 65–99)
Potassium: 3.1 mmol/L — ABNORMAL LOW (ref 3.5–5.1)
SODIUM: 129 mmol/L — AB (ref 135–145)
Total Bilirubin: 0.7 mg/dL (ref 0.3–1.2)
Total Protein: 5.9 g/dL — ABNORMAL LOW (ref 6.5–8.1)

## 2016-12-18 LAB — CBC WITH DIFFERENTIAL/PLATELET
BASOS ABS: 0 10*3/uL (ref 0.0–0.1)
BASOS PCT: 0 %
EOS ABS: 0 10*3/uL (ref 0.0–0.7)
Eosinophils Relative: 0 %
HCT: 27.5 % — ABNORMAL LOW (ref 36.0–46.0)
HEMOGLOBIN: 9 g/dL — AB (ref 12.0–15.0)
LYMPHS PCT: 4 %
Lymphs Abs: 1.1 10*3/uL (ref 0.7–4.0)
MCH: 24.3 pg — ABNORMAL LOW (ref 26.0–34.0)
MCHC: 32.7 g/dL (ref 30.0–36.0)
MCV: 74.3 fL — ABNORMAL LOW (ref 78.0–100.0)
Monocytes Absolute: 1.1 10*3/uL — ABNORMAL HIGH (ref 0.1–1.0)
Monocytes Relative: 4 %
NEUTROS PCT: 92 %
Neutro Abs: 25.2 10*3/uL — ABNORMAL HIGH (ref 1.7–7.7)
Platelets: 427 10*3/uL — ABNORMAL HIGH (ref 150–400)
RBC: 3.7 MIL/uL — AB (ref 3.87–5.11)
RDW: 16.8 % — ABNORMAL HIGH (ref 11.5–15.5)
WBC: 27.4 10*3/uL — ABNORMAL HIGH (ref 4.0–10.5)

## 2016-12-18 LAB — I-STAT CG4 LACTIC ACID, ED
LACTIC ACID, VENOUS: 2.08 mmol/L — AB (ref 0.5–1.9)
LACTIC ACID, VENOUS: 1.17 mmol/L (ref 0.5–1.9)
Lactic Acid, Venous: 1.35 mmol/L (ref 0.5–1.9)

## 2016-12-18 LAB — PROTIME-INR
INR: 1.07
Prothrombin Time: 13.9 seconds (ref 11.4–15.2)

## 2016-12-18 LAB — POC OCCULT BLOOD, ED: FECAL OCCULT BLD: NEGATIVE

## 2016-12-18 LAB — LIPASE, BLOOD: LIPASE: 47 U/L (ref 11–51)

## 2016-12-18 MED ORDER — SODIUM CHLORIDE 0.9 % IV BOLUS (SEPSIS)
1000.0000 mL | Freq: Once | INTRAVENOUS | Status: AC
  Administered 2016-12-18: 1000 mL via INTRAVENOUS

## 2016-12-18 MED ORDER — PIPERACILLIN-TAZOBACTAM 3.375 G IVPB
3.3750 g | Freq: Three times a day (TID) | INTRAVENOUS | Status: DC
  Administered 2016-12-18 – 2016-12-22 (×11): 3.375 g via INTRAVENOUS
  Filled 2016-12-18 (×11): qty 50

## 2016-12-18 MED ORDER — HYDROCODONE-ACETAMINOPHEN 5-325 MG PO TABS
1.0000 | ORAL_TABLET | ORAL | Status: DC | PRN
Start: 2016-12-18 — End: 2016-12-22
  Filled 2016-12-18: qty 1

## 2016-12-18 MED ORDER — CHLORHEXIDINE GLUCONATE 0.12 % MT SOLN
15.0000 mL | Freq: Two times a day (BID) | OROMUCOSAL | Status: DC
  Administered 2016-12-18 – 2016-12-22 (×8): 15 mL via OROMUCOSAL
  Filled 2016-12-18 (×7): qty 15

## 2016-12-18 MED ORDER — ONDANSETRON 4 MG PO TBDP: 4.0000 mg | ORAL_TABLET | Freq: Four times a day (QID) | ORAL | Status: DC | PRN

## 2016-12-18 MED ORDER — ACETAMINOPHEN 650 MG RE SUPP
650.0000 mg | Freq: Once | RECTAL | Status: AC
  Administered 2016-12-18: 650 mg via RECTAL
  Filled 2016-12-18: qty 1

## 2016-12-18 MED ORDER — SODIUM CHLORIDE 0.9 % IV SOLN
INTRAVENOUS | Status: DC
  Administered 2016-12-18 – 2016-12-19 (×2): via INTRAVENOUS

## 2016-12-18 MED ORDER — PIPERACILLIN-TAZOBACTAM 3.375 G IVPB 30 MIN
3.3750 g | Freq: Once | INTRAVENOUS | Status: AC
Start: 2016-12-18 — End: 2016-12-18
  Administered 2016-12-18: 3.375 g via INTRAVENOUS
  Filled 2016-12-18: qty 50

## 2016-12-18 MED ORDER — ORAL CARE MOUTH RINSE
15.0000 mL | Freq: Two times a day (BID) | OROMUCOSAL | Status: DC
  Administered 2016-12-19 – 2016-12-20 (×2): 15 mL via OROMUCOSAL

## 2016-12-18 MED ORDER — ATENOLOL 25 MG PO TABS
50.0000 mg | ORAL_TABLET | Freq: Every day | ORAL | Status: DC
  Administered 2016-12-19 – 2016-12-22 (×4): 50 mg via ORAL
  Filled 2016-12-18 (×4): qty 2

## 2016-12-18 MED ORDER — ONDANSETRON HCL 4 MG/2ML IJ SOLN
4.0000 mg | Freq: Four times a day (QID) | INTRAMUSCULAR | Status: DC | PRN
  Administered 2016-12-18: 4 mg via INTRAVENOUS
  Filled 2016-12-18: qty 2

## 2016-12-18 MED ORDER — LORAZEPAM 2 MG/ML IJ SOLN
0.5000 mg | INTRAMUSCULAR | Status: DC | PRN
  Administered 2016-12-18 – 2016-12-22 (×2): 0.5 mg via INTRAVENOUS
  Filled 2016-12-18 (×2): qty 1

## 2016-12-18 MED ORDER — FENTANYL CITRATE (PF) 100 MCG/2ML IJ SOLN: 25.0000 ug | Freq: Once | INTRAMUSCULAR | Status: DC

## 2016-12-18 MED ORDER — ACETAMINOPHEN 650 MG RE SUPP: 650.0000 mg | Freq: Four times a day (QID) | RECTAL | Status: DC | PRN

## 2016-12-18 MED ORDER — SIMETHICONE 80 MG PO CHEW
40.0000 mg | CHEWABLE_TABLET | Freq: Four times a day (QID) | ORAL | Status: DC | PRN
  Administered 2016-12-20: 40 mg via ORAL
  Filled 2016-12-18: qty 1

## 2016-12-18 MED ORDER — PROMETHAZINE HCL 12.5 MG PO TABS: 12.5000 mg | ORAL_TABLET | Freq: Four times a day (QID) | ORAL | Status: DC | PRN

## 2016-12-18 MED ORDER — ONDANSETRON HCL 4 MG/2ML IJ SOLN: 4.0000 mg | Freq: Once | INTRAMUSCULAR | Status: DC

## 2016-12-18 MED ORDER — IOPAMIDOL (ISOVUE-300) INJECTION 61%
INTRAVENOUS | Status: AC
  Filled 2016-12-18: qty 30

## 2016-12-18 MED ORDER — ACETAMINOPHEN 325 MG PO TABS: 650.0000 mg | ORAL_TABLET | Freq: Four times a day (QID) | ORAL | Status: DC | PRN

## 2016-12-18 MED ORDER — ENSURE ENLIVE PO LIQD: 237.0000 mL | Freq: Two times a day (BID) | ORAL | Status: DC

## 2016-12-18 MED ORDER — MORPHINE SULFATE (PF) 2 MG/ML IV SOLN
2.0000 mg | INTRAVENOUS | Status: DC | PRN
  Administered 2016-12-18 – 2016-12-20 (×3): 2 mg via INTRAVENOUS
  Filled 2016-12-18 (×3): qty 1

## 2016-12-18 MED ORDER — BOOST / RESOURCE BREEZE PO LIQD: 1.0000 | Freq: Three times a day (TID) | ORAL | Status: DC

## 2016-12-18 MED ORDER — IOPAMIDOL (ISOVUE-300) INJECTION 61%
100.0000 mL | Freq: Once | INTRAVENOUS | Status: AC | PRN
  Administered 2016-12-18: 100 mL via INTRAVENOUS

## 2016-12-18 MED ORDER — VANCOMYCIN HCL IN DEXTROSE 1-5 GM/200ML-% IV SOLN
1000.0000 mg | Freq: Once | INTRAVENOUS | Status: AC
  Administered 2016-12-18: 1000 mg via INTRAVENOUS
  Filled 2016-12-18: qty 200

## 2016-12-18 NOTE — ED Provider Notes (Signed)
[facility] DEPT Provider Note   CSN: [id number] Arrival date & time: 12/18/16  1049     History   Chief Complaint Chief Complaint  Patient presents with  . Rectal Bleeding  . Weakness    HPI Erika Diaz is a 71 y.o. female.  Patient complains of weakness and abdominal pain. She was recently discharged from the hospital after having abdominal surgery   The history is provided by the patient. No language interpreter was used.  Weakness  Primary symptoms include no focal weakness. This is a recurrent problem. The current episode started 12 to 24 hours ago. The problem has not changed since onset.There was no focality noted. There has been no fever. Pertinent negatives include no shortness of breath, no chest pain and no headaches. There were no medications administered prior to arrival. Associated medical issues do not include trauma.    Past Medical History:  Diagnosis Date  . Anemia   . Anxiety   . Colon cancer ([place])   . Hypertension   . Vitamin D deficiency     Patient Active Problem List   Diagnosis Date Noted  . S/P partial colectomy 12/08/2016  . Bowel perforation ([place])   . Fracture of femoral neck ([place]) 01/02/2014  . Hip fracture ([place]) 11/28/2013  . Closed right hip fracture ([place]) 11/27/2013  . Femur fracture, right ([place]) 11/27/2013  . Alcohol abuse 11/27/2013  . Hyponatremia 11/27/2013  . HTN (hypertension) 10/11/2013  . Anemia 10/11/2013    Past Surgical History:  Procedure Laterality Date  . BREAST LUMPECTOMY Right    lump removal   . HIP ARTHROPLASTY Right 11/28/2013   Procedure: PARTIAL RIGHT HIP REPLACEMENT (BIPOLAR);  Surgeon: Carole Civil, MD;  Location: [facility];  Service: Orthopedics;  Laterality: Right;  . LAPAROTOMY N/A 12/08/2016   Procedure: EXPLORATORY LAPAROTOMY, PARTIAL COLECTOMY WITH COLOSTOMY;  Surgeon: Aviva Signs, MD;  Location: [facility];  Service: General;  Laterality: N/A;    OB History    No data available        Home Medications    Prior to Admission medications   Medication Sig Start Date End Date Taking? Authorizing Provider  amoxicillin-clavulanate (AUGMENTIN) 875-125 MG tablet Take 1 tablet by mouth 2 (two) times daily. 12/13/16  Yes Aviva Signs, MD  atenolol (TENORMIN) 50 MG tablet Take 1 tablet (50 mg total) by mouth daily. 08/23/14  Yes Chipper Herb, MD  promethazine (PHENERGAN) 25 MG tablet Take 0.5-1 tablets by mouth every 6 (six) hours as needed for nausea. 12/14/16  Yes [provider]    Family History Family History  Problem Relation Age of Onset  . Diabetes Mother   . Stomach cancer Mother     Social History Social History  Substance Use Topics  . Smoking status: Former Smoker    Quit date: 05/26/2004  . Smokeless tobacco: Never Used  . Alcohol use Yes     Comment: Beer occ.- 2 per day     Allergies   Patient has no known allergies.   Review of Systems Review of Systems  Constitutional: Negative for appetite change and fatigue.  HENT: Negative for congestion, ear discharge and sinus pressure.   Eyes: Negative for discharge.  Respiratory: Negative for cough and shortness of breath.   Cardiovascular: Negative for chest pain.  Gastrointestinal: Positive for abdominal pain. Negative for diarrhea.  Genitourinary: Negative for frequency and hematuria.  Musculoskeletal: Negative for back pain.  Skin: Negative for rash.  Neurological: Positive for weakness. Negative  for focal weakness, seizures and headaches.  Psychiatric/Behavioral: Negative for hallucinations.     Physical Exam Updated Vital Signs BP (!) 119/57   Pulse 81   Temp (!) 100.9 F (38.3 C) (Rectal)   Resp (!) 23   SpO2 93%   Physical Exam  Constitutional: She is oriented to person, place, and time. She appears well-developed.  HENT:  Head: Normocephalic.  Eyes: Conjunctivae and EOM are normal. No scleral icterus.  Neck: Neck supple. No thyromegaly present.  Cardiovascular:  Normal rate and regular rhythm.  Exam reveals no gallop and no friction rub.   No murmur heard. Pulmonary/Chest: No stridor. She has no wheezes. She has no rales. She exhibits no tenderness.  Abdominal: She exhibits no distension. There is tenderness. There is no rebound.  Colostomy bag  Musculoskeletal: Normal range of motion. She exhibits no edema.  Lymphadenopathy:    She has no cervical adenopathy.  Neurological: She is oriented to person, place, and time. She exhibits normal muscle tone. Coordination normal.  Skin: No rash noted. No erythema.  Psychiatric: She has a normal mood and affect. Her behavior is normal.     ED Treatments / Results  Labs (all labs ordered are listed, but only abnormal results are displayed) Labs Reviewed  CBC WITH DIFFERENTIAL/PLATELET - Abnormal; Notable for the following:       Result Value   WBC 27.4 (*)    RBC 3.70 (*)    Hemoglobin 9.0 (*)    HCT 27.5 (*)    MCV 74.3 (*)    MCH 24.3 (*)    RDW 16.8 (*)    Platelets 427 (*)    Neutro Abs 25.2 (*)    Monocytes Absolute 1.1 (*)    All other components within normal limits  COMPREHENSIVE METABOLIC PANEL - Abnormal; Notable for the following:    Sodium 129 (*)    Potassium 3.1 (*)    Chloride 95 (*)    Glucose, Bld 111 (*)    BUN 5 (*)    Calcium 8.2 (*)    Total Protein 5.9 (*)    Albumin 2.5 (*)    ALT 12 (*)    All other components within normal limits  URINALYSIS, ROUTINE W REFLEX MICROSCOPIC - Abnormal; Notable for the following:    Color, Urine STRAW (*)    Specific Gravity, Urine 1.041 (*)    Leukocytes, UA TRACE (*)    Squamous Epithelial / LPF 0-5 (*)    All other components within normal limits  I-STAT CG4 LACTIC ACID, ED - Abnormal; Notable for the following:    Lactic Acid, Venous 2.08 (*)    All other components within normal limits  CULTURE, BLOOD (ROUTINE X 2)  CULTURE, BLOOD (ROUTINE X 2)  LIPASE, BLOOD  POC OCCULT BLOOD, ED    EKG  EKG Interpretation None        Radiology Dg Chest 2 View  Result Date: 12/18/2016 CLINICAL DATA:  Generalize weakening, rectal bleeding, normal appetite. Ten days postoperative from colonic resection with colostomy creation. EXAM: CHEST  2 VIEW COMPARISON:  Chest x-ray of December 08, 2016 FINDINGS: The lungs are reasonably well inflated. There is increased density in the right lower lobe posteriorly. There is no pleural effusion. The heart is top-normal in size. The pulmonary vascularity is normal. The observed bony thorax exhibits no acute abnormality. IMPRESSION: Right lower lobe atelectasis or pneumonia.  No CHF. Electronically Signed   By: David  Martinique M.D.   On: 12/18/2016  14:02   Ct Abdomen Pelvis W Contrast  Result Date: 12/18/2016 CLINICAL DATA:  Generalize weakness with rectal bleeding, post op colostomy, history of colon cancer EXAM: CT ABDOMEN AND PELVIS WITH CONTRAST TECHNIQUE: Multidetector CT imaging of the abdomen and pelvis was performed using the standard protocol following bolus administration of intravenous contrast. CONTRAST:  185mL ISOVUE-300 IOPAMIDOL (ISOVUE-300) INJECTION 61% COMPARISON:  12/08/2016 FINDINGS: Lower chest: Small bilateral pleural effusions. Hazy atelectasis at the bilateral lung bases. Borderline cardiomegaly. Mild coronary calcification. Hepatobiliary: Probable focal fat infiltration at the falciform ligament. subcentimeter hypodensity inferior right hepatic lobe too small to further characterize. No calcified gallstones or biliary dilatation. Pancreas: Unremarkable. No pancreatic ductal dilatation or surrounding inflammatory changes. Spleen: Normal in size without focal abnormality. Adrenals/Urinary Tract: Adrenal glands are within normal limits. subcentimeter hypodensity right kidney too small to further characterize. Prominent right extrarenal pelvis. Air within the urinary bladder. Bladder slightly thick-walled. Stomach/Bowel: Stomach is nonenlarged. Small esophageal hiatal hernia. Residual  borderline enlarged small bowel left upper quadrant up to 3 cm with overall decreased small bowel dilatation compared to prior CT. Interval creation of left abdominal colostomy with partial resection of sigmoid colon. Slight thickening of the rectum. Loop of bowel in the low pelvis with mucosal enhancement, appears to be small bowel. Vascular/Lymphatic: Aortic atherosclerosis. No aneurysmal dilatation. No significantly enlarged lymph nodes. Reproductive: Uterus and bilateral adnexa are unremarkable. Other: Small pockets of pneumoperitoneum within the anterior abdomen, likely relates to resolving postoperative air. Soft tissue thickening in the left lower quadrant abdominal wall consistent with recent surgery. Mildly rim enhancing collection in the anterior pelvis slightly to the left of midline measuring 2 x 6.6 cm. Other smaller slightly rim enhancing fluid collections are noted within the pelvis. Musculoskeletal: Degenerative changes. No acute or suspicious bone lesion. IMPRESSION: 1. Status post resection of sigmoid colon with creation of left abdominal colostomy. Few residual foci of pneumoperitoneum fell consistent with resolving postoperative air. Decreasing small bowel dilatation, likely due to resolving bowel obstruction or mild ileus. 2. Multiple slightly rim enhancing fluid collections within the low abdomen and pelvis with largest fluid collection seen anteriorly and measuring up to 6.6 cm, this may represent organizing post- operative collection or developing abscess given history of perforation. 3. Low lying loop of small bowel in the pelvis with prominent mucosal enhancement, similar appearance to prior and may represent inflamed loop of small bowel. 4. Slightly thick-walled appearance of bladder. Air within the anterior bladder, this may be due to recent instrumentation, clinical correlation recommended 5. Small bilateral pleural effusions with dependent atelectasis within both lung bases.  Electronically Signed   By: Donavan Foil M.D.   On: 12/18/2016 14:23    Procedures Procedures (including critical care time)  Medications Ordered in ED Medications  iopamidol (ISOVUE-300) 61 % injection (not administered)  sodium chloride 0.9 % bolus 1,000 mL (0 mLs Intravenous Stopped 12/18/16 1314)  acetaminophen (TYLENOL) suppository 650 mg (650 mg Rectal Given 12/18/16 1255)  piperacillin-tazobactam (ZOSYN) IVPB 3.375 g (0 g Intravenous Stopped 12/18/16 1350)  vancomycin (VANCOCIN) IVPB 1000 mg/200 mL premix (0 mg Intravenous Stopped 12/18/16 1424)  sodium chloride 0.9 % bolus 1,000 mL (1,000 mLs Intravenous New Bag/Given 12/18/16 1314)  iopamidol (ISOVUE-300) 61 % injection 100 mL (100 mLs Intravenous Contrast Given 12/18/16 1328)     Initial Impression / Assessment and Plan / ED Course  I have reviewed the triage vital signs and the nursing notes.  Pertinent labs & imaging results that were available during my care  of the patient were reviewed by me and considered in my medical decision making (see chart for details).     CRITICAL CARE Performed by: Trang Bouse L Total critical care time: 35 minutes Critical care time was exclusive of separately billable procedures and treating other patients. Critical care was necessary to treat or prevent imminent or life-threatening deterioration. Critical care was time spent personally by me on the following activities: development of treatment plan with patient and/or surrogate as well as nursing, discussions with consultants, evaluation of patient's response to treatment, examination of patient, obtaining history from patient or surrogate, ordering and performing treatments and interventions, ordering and review of laboratory studies, ordering and review of radiographic studies, pulse oximetry and re-evaluation of patient's condition.   Final Clinical Impressions(s) / ED Diagnoses   Final diagnoses:  Lower abdominal pain   Patient will  be admitted for abdominal pain and possibly abscess. Her surgeon Dr. Arnoldo Morale will admit her New Prescriptions New Prescriptions   No medications on file     Milton Ferguson, MD 12/18/16 1456

## 2016-12-18 NOTE — H&P (Signed)
Erika Diaz is an 71 y.o. female.   Chief Complaint: Generalized malaise HPI: Patient is a 71 year old white female status post Hartman's procedure for perforated sigmoid colon which overlay breath secondary to colon carcinoma who was disposed to see me in my office today for follow-up, but I was notified by the family that she was having a difficult time standing up. I instructed her to go to the emergency room. While in the emergency room, she was found to have a slightly elevated lactic acid level as well as a significant leukocytosis. She states that she has no specific pain. She has not had any emesis. Her ostomy bag has been working and was changed yesterday. She denies any fever or chills. She has been having mucousy discharge from her anus. She was placed on antibiotics on discharge, but has had a difficult time swallowing the pills and the family has had a difficult time crushing them. They think that she has not taken off her antibiotic prescription.  Past Medical History:  Diagnosis Date  . Anemia   . Anxiety   . Colon cancer ([place])   . Hypertension   . Vitamin D deficiency     Past Surgical History:  Procedure Laterality Date  . BREAST LUMPECTOMY Right    lump removal   . HIP ARTHROPLASTY Right 11/28/2013   Procedure: PARTIAL RIGHT HIP REPLACEMENT (BIPOLAR);  Surgeon: Carole Civil, MD;  Location: [facility];  Service: Orthopedics;  Laterality: Right;  . LAPAROTOMY N/A 12/08/2016   Procedure: EXPLORATORY LAPAROTOMY, PARTIAL COLECTOMY WITH COLOSTOMY;  Surgeon: Aviva Signs, MD;  Location: [facility];  Service: General;  Laterality: N/A;    Family History  Problem Relation Age of Onset  . Diabetes Mother   . Stomach cancer Mother    Social History:  reports that she quit smoking about 12 years ago. She has never used smokeless tobacco. She reports that she drinks alcohol. She reports that she does not use drugs.  Allergies: No Known Allergies   (Not in a hospital  admission)  Results for orders placed or performed during the hospital encounter of 12/18/16 (from the past 48 hour(s))  POC occult blood, ED     Status: None   Collection Time: 12/18/16 11:04 AM  Result Value Ref Range   Fecal Occult Bld NEGATIVE NEGATIVE  CBC with Differential     Status: Abnormal   Collection Time: 12/18/16 11:23 AM  Result Value Ref Range   WBC 27.4 (H) 4.0 - 10.5 K/uL   RBC 3.70 (L) 3.87 - 5.11 MIL/uL   Hemoglobin 9.0 (L) 12.0 - 15.0 g/dL   HCT 27.5 (L) 36.0 - 46.0 %   MCV 74.3 (L) 78.0 - 100.0 fL   MCH 24.3 (L) 26.0 - 34.0 pg   MCHC 32.7 30.0 - 36.0 g/dL   RDW 16.8 (H) 11.5 - 15.5 %   Platelets 427 (H) 150 - 400 K/uL   Neutrophils Relative % 92 %   Lymphocytes Relative 4 %   Monocytes Relative 4 %   Eosinophils Relative 0 %   Basophils Relative 0 %   Neutro Abs 25.2 (H) 1.7 - 7.7 K/uL   Lymphs Abs 1.1 0.7 - 4.0 K/uL   Monocytes Absolute 1.1 (H) 0.1 - 1.0 K/uL   Eosinophils Absolute 0.0 0.0 - 0.7 K/uL   Basophils Absolute 0.0 0.0 - 0.1 K/uL   WBC Morphology WHITE COUNT CONFIRMED ON SMEAR   Comprehensive metabolic panel     Status: Abnormal  Collection Time: 12/18/16 11:23 AM  Result Value Ref Range   Sodium 129 (L) 135 - 145 mmol/L   Potassium 3.1 (L) 3.5 - 5.1 mmol/L   Chloride 95 (L) 101 - 111 mmol/L   CO2 24 22 - 32 mmol/L   Glucose, Bld 111 (H) 65 - 99 mg/dL   BUN 5 (L) 6 - 20 mg/dL   Creatinine, Ser 3.62 0.44 - 1.00 mg/dL   Calcium 8.2 (L) 8.9 - 10.3 mg/dL   Total Protein 5.9 (L) 6.5 - 8.1 g/dL   Albumin 2.5 (L) 3.5 - 5.0 g/dL   AST 17 15 - 41 U/L   ALT 12 (L) 14 - 54 U/L   Alkaline Phosphatase 40 38 - 126 U/L   Total Bilirubin 0.7 0.3 - 1.2 mg/dL   GFR calc non Af Amer >60 >60 mL/min   GFR calc Af Amer >60 >60 mL/min    Comment: (NOTE) The eGFR has been calculated using the CKD EPI equation. This calculation has not been validated in all clinical situations. eGFR's persistently <60 mL/min signify possible Chronic Kidney Disease.     Anion gap 10 5 - 15  Lipase, blood     Status: None   Collection Time: 12/18/16 11:23 AM  Result Value Ref Range   Lipase 47 11 - 51 U/L  Blood Culture (routine x 2)     Status: None (Preliminary result)   Collection Time: 12/18/16 12:44 PM  Result Value Ref Range   Specimen Description BLOOD RIGHT WRIST    Special Requests      BOTTLES DRAWN AEROBIC AND ANAEROBIC Blood Culture results may not be optimal due to an inadequate volume of blood received in culture bottles   Culture PENDING    Report Status PENDING   Blood Culture (routine x 2)     Status: None (Preliminary result)   Collection Time: 12/18/16 12:57 PM  Result Value Ref Range   Specimen Description BLOOD LEFT HAND    Special Requests      BOTTLES DRAWN AEROBIC ONLY Blood Culture results may not be optimal due to an inadequate volume of blood received in culture bottles   Culture PENDING    Report Status PENDING   I-Stat CG4 Lactic Acid, ED  (not at  [facility])     Status: Abnormal   Collection Time: 12/18/16  1:06 PM  Result Value Ref Range   Lactic Acid, Venous 2.08 (HH) 0.5 - 1.9 mmol/L   Comment NOTIFIED PHYSICIAN   Urinalysis, Routine w reflex microscopic     Status: Abnormal   Collection Time: 12/18/16  2:20 PM  Result Value Ref Range   Color, Urine STRAW (A) YELLOW   APPearance CLEAR CLEAR   Specific Gravity, Urine 1.041 (H) 1.005 - 1.030   pH 5.0 5.0 - 8.0   Glucose, UA NEGATIVE NEGATIVE mg/dL   Hgb urine dipstick NEGATIVE NEGATIVE   Bilirubin Urine NEGATIVE NEGATIVE   Ketones, ur NEGATIVE NEGATIVE mg/dL   Protein, ur NEGATIVE NEGATIVE mg/dL   Nitrite NEGATIVE NEGATIVE   Leukocytes, UA TRACE (A) NEGATIVE   RBC / HPF 0-5 0 - 5 RBC/hpf   WBC, UA 0-5 0 - 5 WBC/hpf   Bacteria, UA NONE SEEN NONE SEEN   Squamous Epithelial / LPF 0-5 (A) NONE SEEN   Dg Chest 2 View  Result Date: 12/18/2016 CLINICAL DATA:  Generalize weakening, rectal bleeding, normal appetite. Ten days postoperative from colonic resection with  colostomy creation. EXAM: CHEST  2 VIEW COMPARISON:  Chest x-ray of December 08, 2016 FINDINGS: The lungs are reasonably well inflated. There is increased density in the right lower lobe posteriorly. There is no pleural effusion. The heart is top-normal in size. The pulmonary vascularity is normal. The observed bony thorax exhibits no acute abnormality. IMPRESSION: Right lower lobe atelectasis or pneumonia.  No CHF. Electronically Signed   By: David  Martinique M.D.   On: 12/18/2016 14:02   Ct Abdomen Pelvis W Contrast  Result Date: 12/18/2016 CLINICAL DATA:  Generalize weakness with rectal bleeding, post op colostomy, history of colon cancer EXAM: CT ABDOMEN AND PELVIS WITH CONTRAST TECHNIQUE: Multidetector CT imaging of the abdomen and pelvis was performed using the standard protocol following bolus administration of intravenous contrast. CONTRAST:  133m ISOVUE-300 IOPAMIDOL (ISOVUE-300) INJECTION 61% COMPARISON:  12/08/2016 FINDINGS: Lower chest: Small bilateral pleural effusions. Hazy atelectasis at the bilateral lung bases. Borderline cardiomegaly. Mild coronary calcification. Hepatobiliary: Probable focal fat infiltration at the falciform ligament. subcentimeter hypodensity inferior right hepatic lobe too small to further characterize. No calcified gallstones or biliary dilatation. Pancreas: Unremarkable. No pancreatic ductal dilatation or surrounding inflammatory changes. Spleen: Normal in size without focal abnormality. Adrenals/Urinary Tract: Adrenal glands are within normal limits. subcentimeter hypodensity right kidney too small to further characterize. Prominent right extrarenal pelvis. Air within the urinary bladder. Bladder slightly thick-walled. Stomach/Bowel: Stomach is nonenlarged. Small esophageal hiatal hernia. Residual borderline enlarged small bowel left upper quadrant up to 3 cm with overall decreased small bowel dilatation compared to prior CT. Interval creation of left abdominal colostomy with  partial resection of sigmoid colon. Slight thickening of the rectum. Loop of bowel in the low pelvis with mucosal enhancement, appears to be small bowel. Vascular/Lymphatic: Aortic atherosclerosis. No aneurysmal dilatation. No significantly enlarged lymph nodes. Reproductive: Uterus and bilateral adnexa are unremarkable. Other: Small pockets of pneumoperitoneum within the anterior abdomen, likely relates to resolving postoperative air. Soft tissue thickening in the left lower quadrant abdominal wall consistent with recent surgery. Mildly rim enhancing collection in the anterior pelvis slightly to the left of midline measuring 2 x 6.6 cm. Other smaller slightly rim enhancing fluid collections are noted within the pelvis. Musculoskeletal: Degenerative changes. No acute or suspicious bone lesion. IMPRESSION: 1. Status post resection of sigmoid colon with creation of left abdominal colostomy. Few residual foci of pneumoperitoneum fell consistent with resolving postoperative air. Decreasing small bowel dilatation, likely due to resolving bowel obstruction or mild ileus. 2. Multiple slightly rim enhancing fluid collections within the low abdomen and pelvis with largest fluid collection seen anteriorly and measuring up to 6.6 cm, this may represent organizing post- operative collection or developing abscess given history of perforation. 3. Low lying loop of small bowel in the pelvis with prominent mucosal enhancement, similar appearance to prior and may represent inflamed loop of small bowel. 4. Slightly thick-walled appearance of bladder. Air within the anterior bladder, this may be due to recent instrumentation, clinical correlation recommended 5. Small bilateral pleural effusions with dependent atelectasis within both lung bases. Electronically Signed   By: KDonavan FoilM.D.   On: 12/18/2016 14:23    Review of Systems  Constitutional: Positive for malaise/fatigue. Negative for chills and fever.  HENT: Negative.    Eyes: Negative.   Cardiovascular: Negative.   Gastrointestinal: Positive for abdominal pain and heartburn.  Genitourinary: Negative.   Musculoskeletal: Negative.   Skin: Negative.   Neurological: Negative.   Endo/Heme/Allergies: Negative.   Psychiatric/Behavioral: Negative.     Blood pressure (!) 119/57, pulse 81, temperature (!) 100.9  F (38.3 C), temperature source Rectal, resp. rate (!) 23, SpO2 93 %. Physical Exam  Vitals reviewed. Constitutional: She is oriented to person, place, and time. She appears well-developed and well-nourished. No distress.  HENT:  Head: Normocephalic and atraumatic.  Cardiovascular: Normal rate, regular rhythm and normal heart sounds.   No murmur heard. Respiratory: Effort normal and breath sounds normal. She has no wheezes. She has no rales.  GI: Soft. Bowel sounds are normal. She exhibits no distension. There is no tenderness. There is no rebound.  Colostomy pink and patent along left side of abdomen  Neurological: She is alert and oriented to person, place, and time.  Skin: Skin is warm and dry.    CT scan images personally reviewed Assessment/Plan Impression: Leukocytosis secondary to possible intra-abdominal abscess, status post emergent Hartman's procedure for perforated obstructing colon cancer Plan: We'll admit patient to the hospital for IV antibiotics. We will consult interventional radiology for percutaneous drainage. Further management pending those results.  Aviva Signs, MD 12/18/2016, 2:54 PM

## 2016-12-18 NOTE — ED Triage Notes (Signed)
10 day post of colon resection with colostomy. Pt c/o generalized weakness and bleeding from rectum that is bright red with wiping only. A/o. Gray in color. Eating/drinking normal. bs 129 en route. Denies pain.

## 2016-12-18 NOTE — Progress Notes (Signed)
Noted moderate amount of mucus with small amount of blood noted in patients briefs. Patient notes it has been occurring for a week now.

## 2016-12-18 NOTE — ED Notes (Signed)
Colostomy bag emptied.

## 2016-12-18 NOTE — ED Notes (Signed)
Ostomy bag has medium brown loose stool. abd incision with staples present and no ss of infection noted.

## 2016-12-18 NOTE — ED Notes (Signed)
Awaiting lab to get 2nd bl cx

## 2016-12-18 NOTE — ED Notes (Addendum)
Anal mucous noted in urine sample. Will cath later. Pt aware. Ct aware pt ready

## 2016-12-18 NOTE — ED Notes (Signed)
Pt taken to ct 

## 2016-12-19 ENCOUNTER — Encounter (HOSPITAL_COMMUNITY): Payer: Self-pay

## 2016-12-19 ENCOUNTER — Ambulatory Visit (HOSPITAL_COMMUNITY)
Admit: 2016-12-19 | Discharge: 2016-12-19 | Disposition: A | Discharge status: Home / Self Care | Payer: Medicare HMO | Attending: General Surgery | Admitting: General Surgery

## 2016-12-19 DIAGNOSIS — Z85038 Personal history of other malignant neoplasm of large intestine: Secondary | ICD-10-CM | POA: Insufficient documentation

## 2016-12-19 DIAGNOSIS — Z79899 Other long term (current) drug therapy: Secondary | ICD-10-CM | POA: Insufficient documentation

## 2016-12-19 DIAGNOSIS — K651 Peritoneal abscess: Secondary | ICD-10-CM

## 2016-12-19 DIAGNOSIS — Y836 Removal of other organ (partial) (total) as the cause of abnormal reaction of the patient, or of later complication, without mention of misadventure at the time of the procedure: Secondary | ICD-10-CM | POA: Insufficient documentation

## 2016-12-19 DIAGNOSIS — I10 Essential (primary) hypertension: Secondary | ICD-10-CM | POA: Insufficient documentation

## 2016-12-19 DIAGNOSIS — Z96641 Presence of right artificial hip joint: Secondary | ICD-10-CM | POA: Insufficient documentation

## 2016-12-19 DIAGNOSIS — Z87891 Personal history of nicotine dependence: Secondary | ICD-10-CM

## 2016-12-19 DIAGNOSIS — T814XXA Infection following a procedure, initial encounter: Secondary | ICD-10-CM

## 2016-12-19 DIAGNOSIS — Z933 Colostomy status: Secondary | ICD-10-CM

## 2016-12-19 LAB — BASIC METABOLIC PANEL
Anion gap: 7 (ref 5–15)
BUN: 6 mg/dL (ref 6–20)
CALCIUM: 7.2 mg/dL — AB (ref 8.9–10.3)
CHLORIDE: 102 mmol/L (ref 101–111)
CO2: 22 mmol/L (ref 22–32)
CREATININE: 0.78 mg/dL (ref 0.44–1.00)
GFR calc non Af Amer: >60 mL/min (ref >60)
Glucose, Bld: 75 mg/dL (ref 65–99)
Potassium: 2.3 mmol/L — CL (ref 3.5–5.1)
SODIUM: 131 mmol/L — AB (ref 135–145)

## 2016-12-19 LAB — CBC
HEMATOCRIT: 24.9 % — AB (ref 36.0–46.0)
HEMOGLOBIN: 8.1 g/dL — AB (ref 12.0–15.0)
MCH: 24.3 pg — AB (ref 26.0–34.0)
MCHC: 32.5 g/dL (ref 30.0–36.0)
MCV: 74.8 fL — ABNORMAL LOW (ref 78.0–100.0)
Platelets: 400 10*3/uL (ref 150–400)
RBC: 3.33 MIL/uL — ABNORMAL LOW (ref 3.87–5.11)
RDW: 17 % — AB (ref 11.5–15.5)
WBC: 20 10*3/uL — ABNORMAL HIGH (ref 4.0–10.5)

## 2016-12-19 LAB — MAGNESIUM: MAGNESIUM: 1.4 mg/dL — AB (ref 1.7–2.4)

## 2016-12-19 LAB — PHOSPHORUS: PHOSPHORUS: 3.4 mg/dL (ref 2.5–4.6)

## 2016-12-19 MED ORDER — MIDAZOLAM HCL 2 MG/2ML IJ SOLN
INTRAMUSCULAR | Status: AC
  Filled 2016-12-19: qty 4

## 2016-12-19 MED ORDER — PRO-STAT SUGAR FREE PO LIQD
30.0000 mL | Freq: Two times a day (BID) | ORAL | Status: DC
  Administered 2016-12-19 – 2016-12-22 (×3): 30 mL via ORAL
  Filled 2016-12-19 (×2): qty 30

## 2016-12-19 MED ORDER — MIDAZOLAM HCL 2 MG/2ML IJ SOLN
INTRAMUSCULAR | Status: AC | PRN
  Administered 2016-12-19: 1 mg via INTRAVENOUS

## 2016-12-19 MED ORDER — POTASSIUM CHLORIDE 10 MEQ/100ML IV SOLN
INTRAVENOUS | Status: AC
  Filled 2016-12-19: qty 400

## 2016-12-19 MED ORDER — POTASSIUM CHLORIDE 10 MEQ/100ML IV SOLN
10.0000 meq | INTRAVENOUS | Status: AC
  Administered 2016-12-19 (×4): 10 meq via INTRAVENOUS

## 2016-12-19 MED ORDER — FENTANYL CITRATE (PF) 100 MCG/2ML IJ SOLN
INTRAMUSCULAR | Status: AC | PRN
  Administered 2016-12-19: 50 ug via INTRAVENOUS

## 2016-12-19 MED ORDER — SODIUM CHLORIDE 0.9% FLUSH: 5.0000 mL | Freq: Three times a day (TID) | INTRAVENOUS | Status: DC

## 2016-12-19 MED ORDER — FENTANYL CITRATE (PF) 100 MCG/2ML IJ SOLN
INTRAMUSCULAR | Status: AC
  Filled 2016-12-19: qty 2

## 2016-12-19 MED ORDER — KCL IN DEXTROSE-NACL 40-5-0.45 MEQ/L-%-% IV SOLN
INTRAVENOUS | Status: DC
  Administered 2016-12-19 – 2016-12-21 (×3): via INTRAVENOUS

## 2016-12-19 MED ORDER — LIDOCAINE HCL (PF) 1 % IJ SOLN
INTRAMUSCULAR | Status: AC
  Filled 2016-12-19: qty 30

## 2016-12-19 NOTE — Sedation Documentation (Signed)
Patient is resting comfortably. 

## 2016-12-19 NOTE — Progress Notes (Signed)
CRITICAL VALUE ALERT  Critical Value:  Potassium 2.3  Date & Time Notied:  12/19/16 @ 0733  Provider Notified: Aviva Signs, MD  Orders Received/Actions taken: Will give Potassium runs.

## 2016-12-19 NOTE — Progress Notes (Signed)
[facility] arrived to transport pt to [facility] for procedure.

## 2016-12-19 NOTE — Progress Notes (Signed)
Chief Complaint: Patient was seen in consultation today for abdominal abscess at the request of [facility]  Referring Physician(s): Aviva Signs  Supervising Physician: Arne Cleveland  Patient Status: [facility] - In-pt  History of Present Illness: Erika Diaz is a 71 y.o. female who underwent partial colectomy with Hartman's procedure secondary to perforated colon from cancer a couple weeks ago. She developed some worsening abd pain and fevers and was re-admitted. A CT done shows a few rim enhancing fluid collections concerning for abscess, the largest measuring about 6-7cm. IR is asked to place perc drain. Chart, imaging, labs, allergies reviewed. She has been NPO.  Past Medical History:  Diagnosis Date  . Anemia   . Anxiety   . Colon cancer ([place])   . Hypertension   . Vitamin D deficiency     Past Surgical History:  Procedure Laterality Date  . BREAST LUMPECTOMY Right    lump removal   . COLON SURGERY    . HIP ARTHROPLASTY Right 11/28/2013   Procedure: PARTIAL RIGHT HIP REPLACEMENT (BIPOLAR);  Surgeon: Carole Civil, MD;  Location: [facility];  Service: Orthopedics;  Laterality: Right;  . LAPAROTOMY N/A 12/08/2016   Procedure: EXPLORATORY LAPAROTOMY, PARTIAL COLECTOMY WITH COLOSTOMY;  Surgeon: Aviva Signs, MD;  Location: [facility];  Service: General;  Laterality: N/A;    Allergies: Patient has no known allergies.  Medications: No current facility-administered medications for this encounter.  No current outpatient prescriptions on file.  Facility-Administered Medications Ordered in Other Encounters:  .  acetaminophen (TYLENOL) tablet 650 mg, 650 mg, Oral, Q6H PRN **OR** acetaminophen (TYLENOL) suppository 650 mg, 650 mg, Rectal, Q6H PRN, Aviva Signs, MD .  atenolol (TENORMIN) tablet 50 mg, 50 mg, Oral, Daily, Aviva Signs, MD, 50 mg at 12/19/16 0801 .  chlorhexidine (PERIDEX) 0.12 % solution 15 mL, 15 mL, Mouth Rinse, BID, Aviva Signs, MD, 15 mL at 12/19/16  0802 .  dextrose 5 % and 0.45 % NaCl with KCl 40 mEq/L infusion, , Intravenous, Continuous, Aviva Signs, MD, Last Rate: 75 mL/hr at 12/19/16 0754 .  feeding supplement (PRO-STAT SUGAR FREE 64) liquid 30 mL, 30 mL, Oral, BID, Aviva Signs, MD .  HYDROcodone-acetaminophen (NORCO/VICODIN) 5-325 MG per tablet 1 tablet, 1 tablet, Oral, Q4H PRN, Aviva Signs, MD .  LORazepam (ATIVAN) injection 0.5 mg, 0.5 mg, Intravenous, Q4H PRN, Aviva Signs, MD, 0.5 mg at 12/18/16 1730 .  MEDLINE mouth rinse, 15 mL, Mouth Rinse, q12n4p, Aviva Signs, MD, 15 mL at 12/19/16 1200 .  morphine 2 MG/ML injection 2 mg, 2 mg, Intravenous, Q3H PRN, Aviva Signs, MD, 2 mg at 12/18/16 1730 .  ondansetron (ZOFRAN-ODT) disintegrating tablet 4 mg, 4 mg, Oral, Q6H PRN **OR** ondansetron (ZOFRAN) injection 4 mg, 4 mg, Intravenous, Q6H PRN, Aviva Signs, MD, 4 mg at 12/18/16 1719 .  piperacillin-tazobactam (ZOSYN) IVPB 3.375 g, 3.375 g, Intravenous, Q8H, Aviva Signs, MD, Stopped at 12/19/16 1556 .  promethazine (PHENERGAN) tablet 12.5-25 mg, 12.5-25 mg, Oral, Q6H PRN, Aviva Signs, MD .  simethicone [facility]) chewable tablet 40 mg, 40 mg, Oral, Q6H PRN, Aviva Signs, MD    Family History  Problem Relation Age of Onset  . Diabetes Mother   . Stomach cancer Mother     Social History   Social History  . Marital status: Married    Spouse name: N/A  . Number of children: 2  . Years of education: N/A   Occupational History  . housewife    Social History Main Topics  . Smoking  status: Former Smoker    Quit date: 05/26/2004  . Smokeless tobacco: Never Used  . Alcohol use Yes     Comment: Beer occ.- 2 per day  . Drug use: No  . Sexual activity: Not Asked   Other Topics Concern  . None   Social History Narrative  . None    Review of Systems: A 12 point ROS discussed and pertinent positives are indicated in the HPI above.  All other systems are negative.  Review of Systems  Vital Signs: There were no  vitals taken for this visit.  Physical Exam  Constitutional: She is oriented to person, place, and time. She appears well-developed. No distress.  HENT:  Head: Normocephalic.  Mouth/Throat: Oropharynx is clear and moist.  Neck: Normal range of motion. No JVD present. No tracheal deviation present.  Cardiovascular: Normal rate, regular rhythm and normal heart sounds.   Pulmonary/Chest: Effort normal and breath sounds normal. No respiratory distress.  Abdominal: Soft. She exhibits no distension. There is tenderness.  Left sided colostomy noted, bag with air and liquid stool Midline incision c/d/i, staples intact LLQ tenderness  Neurological: She is alert and oriented to person, place, and time.  Skin: Skin is warm and dry.  Psychiatric: She has a normal mood and affect. Judgment normal.    Mallampati Score:  MD Evaluation Airway: WNL Heart: WNL Abdomen: WNL Chest/ Lungs: WNL ASA  Classification: 3 Mallampati/Airway Score: One  Imaging: Dg Chest 2 View  Result Date: 12/18/2016 CLINICAL DATA:  Generalize weakening, rectal bleeding, normal appetite. Ten days postoperative from colonic resection with colostomy creation. EXAM: CHEST  2 VIEW COMPARISON:  Chest x-ray of December 08, 2016 FINDINGS: The lungs are reasonably well inflated. There is increased density in the right lower lobe posteriorly. There is no pleural effusion. The heart is top-normal in size. The pulmonary vascularity is normal. The observed bony thorax exhibits no acute abnormality. IMPRESSION: Right lower lobe atelectasis or pneumonia.  No CHF. Electronically Signed   By: David  Martinique M.D.   On: 12/18/2016 14:02   Ct Abdomen Pelvis W Contrast  Result Date: 12/18/2016 CLINICAL DATA:  Generalize weakness with rectal bleeding, post op colostomy, history of colon cancer EXAM: CT ABDOMEN AND PELVIS WITH CONTRAST TECHNIQUE: Multidetector CT imaging of the abdomen and pelvis was performed using the standard protocol following  bolus administration of intravenous contrast. CONTRAST:  145mL ISOVUE-300 IOPAMIDOL (ISOVUE-300) INJECTION 61% COMPARISON:  12/08/2016 FINDINGS: Lower chest: Small bilateral pleural effusions. Hazy atelectasis at the bilateral lung bases. Borderline cardiomegaly. Mild coronary calcification. Hepatobiliary: Probable focal fat infiltration at the falciform ligament. subcentimeter hypodensity inferior right hepatic lobe too small to further characterize. No calcified gallstones or biliary dilatation. Pancreas: Unremarkable. No pancreatic ductal dilatation or surrounding inflammatory changes. Spleen: Normal in size without focal abnormality. Adrenals/Urinary Tract: Adrenal glands are within normal limits. subcentimeter hypodensity right kidney too small to further characterize. Prominent right extrarenal pelvis. Air within the urinary bladder. Bladder slightly thick-walled. Stomach/Bowel: Stomach is nonenlarged. Small esophageal hiatal hernia. Residual borderline enlarged small bowel left upper quadrant up to 3 cm with overall decreased small bowel dilatation compared to prior CT. Interval creation of left abdominal colostomy with partial resection of sigmoid colon. Slight thickening of the rectum. Loop of bowel in the low pelvis with mucosal enhancement, appears to be small bowel. Vascular/Lymphatic: Aortic atherosclerosis. No aneurysmal dilatation. No significantly enlarged lymph nodes. Reproductive: Uterus and bilateral adnexa are unremarkable. Other: Small pockets of pneumoperitoneum within the anterior abdomen, likely relates  to resolving postoperative air. Soft tissue thickening in the left lower quadrant abdominal wall consistent with recent surgery. Mildly rim enhancing collection in the anterior pelvis slightly to the left of midline measuring 2 x 6.6 cm. Other smaller slightly rim enhancing fluid collections are noted within the pelvis. Musculoskeletal: Degenerative changes. No acute or suspicious bone lesion.  IMPRESSION: 1. Status post resection of sigmoid colon with creation of left abdominal colostomy. Few residual foci of pneumoperitoneum fell consistent with resolving postoperative air. Decreasing small bowel dilatation, likely due to resolving bowel obstruction or mild ileus. 2. Multiple slightly rim enhancing fluid collections within the low abdomen and pelvis with largest fluid collection seen anteriorly and measuring up to 6.6 cm, this may represent organizing post- operative collection or developing abscess given history of perforation. 3. Low lying loop of small bowel in the pelvis with prominent mucosal enhancement, similar appearance to prior and may represent inflamed loop of small bowel. 4. Slightly thick-walled appearance of bladder. Air within the anterior bladder, this may be due to recent instrumentation, clinical correlation recommended 5. Small bilateral pleural effusions with dependent atelectasis within both lung bases. Electronically Signed   By: Donavan Foil M.D.   On: 12/18/2016 14:23   Ct Abdomen Pelvis W Contrast  Result Date: 12/08/2016 CLINICAL DATA:  Abdominal pain and bilious emesis. EXAM: CT ABDOMEN AND PELVIS WITH CONTRAST TECHNIQUE: Multidetector CT imaging of the abdomen and pelvis was performed using the standard protocol following bolus administration of intravenous contrast. CONTRAST:  147mL ISOVUE-300 IOPAMIDOL (ISOVUE-300) INJECTION 61% COMPARISON:  CT abdomen pelvis 11/03/2007 FINDINGS: Lower chest: Bibasilar subsegmental atelectasis. Hepatobiliary: Normal hepatic size and contours without focal liver lesion. No perihepatic ascites. No intra- or extrahepatic biliary dilatation. Normal gallbladder. Pancreas: Normal pancreatic contours and enhancement. No peripancreatic fluid collection or pancreatic ductal dilatation. Spleen: Normal. Adrenals/Urinary Tract: Unchanged subcentimeter right adrenal nodule. Bilateral lobulated renal contours without focal mass. Stomach/Bowel: There  is clustered small bowel in the pelvis with surrounding free air and mild adjacent edema. The stomach is distended. There are multiple loops of dilated jejunum. There is swirling of the mesenteric vasculature. In the central abdomen, there is a gas and fluid collection that measures approximately 4.4 x 4.0 cm. There are multiple other smaller areas of free fluid. Multifocal free air in the anterior abdomen. Vascular/Lymphatic: There is atherosclerotic calcification of the non aneurysmal abdominal aorta. No abdominal or pelvic adenopathy. Reproductive: Status post hysterectomy. Musculoskeletal: Right total hip arthroplasty. No acute abnormality. Normal visualized extrathoracic and extraperitoneal soft tissues. Other: No contributory non-categorized findings. IMPRESSION: 1. Clustered small bowel in the upper pelvis with associated dilated proximal small bowel extending to the proximal jejunum in association with twisted appearance of the mesenteric vasculature and mesenteric edema is most consistent with closed loop small-bowel obstruction. 2. Pneumoperitoneum and multiple areas of free fluid indicating perforation. 4 cm gas and fluid collection in the central abdomen. 3.  Aortic Atherosclerosis (ICD10-I70.0). Critical Value/emergent results were called by telephone at the time of interpretation on 12/08/2016 at 2:54 pm to Dr. Elnora Morrison , who verbally acknowledged these results. Electronically Signed   By: Ulyses Jarred M.D.   On: 12/08/2016 14:58   Dg Chest Portable 1 View  Result Date: 12/08/2016 CLINICAL DATA:  Right lower quadrant abdominal pain over the last 3 days. EXAM: PORTABLE CHEST 1 VIEW COMPARISON:  11/27/2013 FINDINGS: Heart size is at the upper limits of normal. Mediastinal shadows are normal. Markings are slightly prominent other lung bases, possibly due to atelectasis.  The stomach appears to be gas filled. No free air seen under the right hemidiaphragm. No acute bone finding. IMPRESSION:  Slightly prominent markings at the lung bases, most likely secondary to poor inspiration. Electronically Signed   By: Nelson Chimes M.D.   On: 12/08/2016 16:08    Labs:  CBC:  Recent Labs  12/11/16 0407 12/12/16 0050 12/18/16 1123 12/19/16 0619  WBC 7.4 8.1 27.4* 20.0*  HGB 8.8* 8.5* 9.0* 8.1*  HCT 27.9* 26.1* 27.5* 24.9*  PLT 434* 363 427* 400    COAGS:  Recent Labs  12/18/16 1131  INR 1.07    BMP:  Recent Labs  12/10/16 0419 12/11/16 0407 12/18/16 1123 12/19/16 0619  NA 130* 132* 129* 131*  K 4.2 4.3 3.1* 2.3*  CL 95* 94* 95* 102  CO2 27 29 24 22   GLUCOSE 104* 95 111* 75  BUN 21* 15 5* 6  CALCIUM 8.5* 8.3* 8.2* 7.2*  CREATININE 0.81 0.88 0.67 0.78  GFRNONAA >60 >60 >60 >60  GFRAA >60 >60 >60 >60    LIVER FUNCTION TESTS:  Recent Labs  12/08/16 1150 12/18/16 1123  BILITOT 0.7 0.7  AST 24 17  ALT 12* 12*  ALKPHOS 65 40  PROT 7.4 5.9*  ALBUMIN 3.0* 2.5*    TUMOR MARKERS: No results for input(s): AFPTM, CEA, CA199, CHROMGRNA in the last 8760 hours.  Assessment and Plan: S/p Hartman's procedure for perforated colon cancer Post op fluid collection/abscess Plan for perc drain Labs ok Risks and Benefits discussed with the patient including bleeding, infection, damage to adjacent structures, bowel perforation/fistula connection, and sepsis. All of the patient's questions were answered, patient is agreeable to proceed. Consent signed and in chart.    Thank you for this interesting consult.  I greatly enjoyed meeting Erika Diaz and look forward to participating in their care.  A copy of this report was sent to the requesting provider on this date.  Electronically Signed: Ascencion Dike, PA-C 12/19/2016, 2:29 PM   I spent a total of 20 minutes in face to face in clinical consultation, greater than 50% of which was counseling/coordinating care for abdominal abscess drain

## 2016-12-19 NOTE — Progress Notes (Signed)
Initial Nutrition Assessment  DOCUMENTATION CODES:  Not applicable  INTERVENTION:  Will order 30 mL Prostat BID, each supplement provides 100 kcal and 15 grams of protein.  Magic cup TID with meals, each supplement provides 290 kcal and 9 grams of protein  Took food requests/preferences for when diet is advanced  NUTRITION DIAGNOSIS:  Increased nutrient needs related to cancer and cancer related treatments, wound healing, acute illness (infection) as evidenced by cumulative nutritional requirements for these conditions  GOAL:  Patient will meet greater than or equal to 90% of their needs  MONITOR:  PO intake, Supplement acceptance, Labs, Weight trends, Diet advancement, Skin  REASON FOR ASSESSMENT:  Malnutrition Screening Tool    ASSESSMENT:  71 y/o female PMHx HTN, Anxiety. Admitted from 7/16-7/21 for bowel perforation s/p Hartmans procedure 7/16. Final pathology demonstrated adenocarcinoma of colon. Represented to hospital with generalized malaise and weakness. Worked up for intra-abdominal abscess and admitted for percutaneous drain placement.   Patient reports that she had a good appetite at home, in between admissions. Denies any N/V. Ostomy has been functioning appropriately.   Rerprots UBW as "120 something". Bed weight today is 125 lbs, however she has some visible swelling. Wt appears to have been fairly stable the past couple months.   She does not like Ensure, but was agreeable to various other supplements. Also took food preferences for once diet is advanced.   NFPE: deferred at this time.   Labs: WBC: 20.0, K: 2.3, H/H:8.1/24.9, Albumin: 2.5 Meds: [employer], Ensure, KCL, IV abx, IVF w/ K,     Recent Labs Lab 12/18/16 1123 12/19/16 0619  NA 129* 131*  K 3.1* 2.3*  CL 95* 102  CO2 24 22  BUN 5* 6  CREATININE 0.67 0.78  CALCIUM 8.2* 7.2*  MG  --  1.4*  PHOS  --  3.4  GLUCOSE 111* 75   Diet Order:  Diet NPO time specified  Skin: Surgical  incision to abdomen  Last BM:  7/27 -250 ml out today  Height:  Ht Readings from Last 1 Encounters:  12/19/16 5\' 4"  (1.626 m)   Weight:  Wt Readings from Last 1 Encounters:  12/19/16 125 lb (56.7 kg)   Wt Readings from Last 10 Encounters:  12/19/16 125 lb (56.7 kg)  12/08/16 120 lb 13 oz (54.8 kg)  11/12/16 119 lb 8 oz (54.2 kg)  01/02/14 158 lb (71.7 kg)  11/29/13 158 lb 11.7 oz (72 kg)  10/11/13 142 lb 3.2 oz (64.5 kg)   Ideal Body Weight:  54.54 kg  BMI:  Body mass index is 21.46 kg/m.  Estimated Nutritional Needs:  Kcal:  1700-1850 kcals (30-33 kca;/kg bw) Protein:  80-90g Pro (1.4-1.6g/kg bw) Fluid:  >1.7 L (30 ml/kg bw)  EDUCATION NEEDS:  No education needs identified at this time  Erika Diaz RD, LDN, [facility] Pager: [phone] 12/19/2016 1:38 PM

## 2016-12-19 NOTE — Procedures (Signed)
  Procedure:   LLQ abscess drain placement  73ml purulent aspirate sent for GS, C&S Preprocedure diagnosis:  Post op abscess Postprocedure diagnosis:  same EBL:     minimal Complications:   none immediate  See full dictation in [facility].  Dillard Cannon MD Main # [phone] Pager  [phone]

## 2016-12-19 NOTE — Progress Notes (Signed)
Report called from Amy at [facility], awaiting [facility] to transport pt back.

## 2016-12-19 NOTE — Care Management Note (Signed)
Case Management Note  Patient Details  Name: Erika Diaz MRN: [id number] Date of Birth: 11-04-1945  Subjective/Objective:                  Pt admitted with peritoneal abscess. Pt is from home with her husband. She reports being ind with [facility]'s pta. Pt active with [facility] for nursing. Pt plans to resume services at DC. [facility] is aware of admission to hospital.   Action/Plan: [place] home with resumption of HH. Brad, [facility] rep, will obain pt info from chart and aware of potential DC home over weekend. Pt made aware HH will have 48hrs to resume services at DC.  Expected Discharge Date:      12/20/2016            Expected Discharge Plan:  [place]  In-House Referral:  NA  Discharge planning Services  CM Consult  Post Acute Care Choice:  Home Health Choice offered to:  Patient  HH Arranged:  RN [facility] Agency:  [place]  Status of Service:  Completed, signed off  Sherald Barge, RN 12/19/2016, 12:35 PM

## 2016-12-19 NOTE — Progress Notes (Signed)
Subjective: Patient feels slightly better.  Objective: Vital signs in last 24 hours: Temp:  [98.7 F (37.1 C)-101.4 F (38.6 C)] 98.8 F (37.1 C) (07/27 1249) Pulse Rate:  [66-87] 87 (07/27 1249) Resp:  [18-23] 18 (07/27 1249) BP: (102-134)/(42-66) 102/66 (07/27 1249) SpO2:  [94 %-99 %] 99 % (07/27 1249) Weight:  [125 lb (56.7 kg)] 125 lb (56.7 kg) (07/27 1100) Last BM Date: 12/19/16  Intake/Output from previous day: 07/26 0701 - 07/27 0700 In: 2572.5 [I.V.:22.5; IV Piggyback:2550] Out: 250 [Urine:250] Intake/Output this shift: Total I/O In: -  Out: 550 [Urine:300; Stool:250]  General appearance: alert, cooperative and no distress Resp: clear to auscultation bilaterally Cardio: regular rate and rhythm, S1, S2 normal, no murmur, click, rub or gallop GI: Soft, incision healing well. Ostomy pink and patent. No rigidity noted.  Lab Results:   Recent Labs  12/18/16 1123 12/19/16 0619  WBC 27.4* 20.0*  HGB 9.0* 8.1*  HCT 27.5* 24.9*  PLT 427* 400   BMET  Recent Labs  12/18/16 1123 12/19/16 0619  NA 129* 131*  K 3.1* 2.3*  CL 95* 102  CO2 24 22  GLUCOSE 111* 75  BUN 5* 6  CREATININE 0.67 0.78  CALCIUM 8.2* 7.2*   PT/INR  Recent Labs  12/18/16 1131  LABPROT 13.9  INR 1.07    Studies/Results: Dg Chest 2 View  Result Date: 12/18/2016 CLINICAL DATA:  Generalize weakening, rectal bleeding, normal appetite. Ten days postoperative from colonic resection with colostomy creation. EXAM: CHEST  2 VIEW COMPARISON:  Chest x-ray of December 08, 2016 FINDINGS: The lungs are reasonably well inflated. There is increased density in the right lower lobe posteriorly. There is no pleural effusion. The heart is top-normal in size. The pulmonary vascularity is normal. The observed bony thorax exhibits no acute abnormality. IMPRESSION: Right lower lobe atelectasis or pneumonia.  No CHF. Electronically Signed   By: David  Martinique M.D.   On: 12/18/2016 14:02   Ct Abdomen Pelvis W  Contrast  Result Date: 12/18/2016 CLINICAL DATA:  Generalize weakness with rectal bleeding, post op colostomy, history of colon cancer EXAM: CT ABDOMEN AND PELVIS WITH CONTRAST TECHNIQUE: Multidetector CT imaging of the abdomen and pelvis was performed using the standard protocol following bolus administration of intravenous contrast. CONTRAST:  160mL ISOVUE-300 IOPAMIDOL (ISOVUE-300) INJECTION 61% COMPARISON:  12/08/2016 FINDINGS: Lower chest: Small bilateral pleural effusions. Hazy atelectasis at the bilateral lung bases. Borderline cardiomegaly. Mild coronary calcification. Hepatobiliary: Probable focal fat infiltration at the falciform ligament. subcentimeter hypodensity inferior right hepatic lobe too small to further characterize. No calcified gallstones or biliary dilatation. Pancreas: Unremarkable. No pancreatic ductal dilatation or surrounding inflammatory changes. Spleen: Normal in size without focal abnormality. Adrenals/Urinary Tract: Adrenal glands are within normal limits. subcentimeter hypodensity right kidney too small to further characterize. Prominent right extrarenal pelvis. Air within the urinary bladder. Bladder slightly thick-walled. Stomach/Bowel: Stomach is nonenlarged. Small esophageal hiatal hernia. Residual borderline enlarged small bowel left upper quadrant up to 3 cm with overall decreased small bowel dilatation compared to prior CT. Interval creation of left abdominal colostomy with partial resection of sigmoid colon. Slight thickening of the rectum. Loop of bowel in the low pelvis with mucosal enhancement, appears to be small bowel. Vascular/Lymphatic: Aortic atherosclerosis. No aneurysmal dilatation. No significantly enlarged lymph nodes. Reproductive: Uterus and bilateral adnexa are unremarkable. Other: Small pockets of pneumoperitoneum within the anterior abdomen, likely relates to resolving postoperative air. Soft tissue thickening in the left lower quadrant abdominal wall  consistent with recent  surgery. Mildly rim enhancing collection in the anterior pelvis slightly to the left of midline measuring 2 x 6.6 cm. Other smaller slightly rim enhancing fluid collections are noted within the pelvis. Musculoskeletal: Degenerative changes. No acute or suspicious bone lesion. IMPRESSION: 1. Status post resection of sigmoid colon with creation of left abdominal colostomy. Few residual foci of pneumoperitoneum fell consistent with resolving postoperative air. Decreasing small bowel dilatation, likely due to resolving bowel obstruction or mild ileus. 2. Multiple slightly rim enhancing fluid collections within the low abdomen and pelvis with largest fluid collection seen anteriorly and measuring up to 6.6 cm, this may represent organizing post- operative collection or developing abscess given history of perforation. 3. Low lying loop of small bowel in the pelvis with prominent mucosal enhancement, similar appearance to prior and may represent inflamed loop of small bowel. 4. Slightly thick-walled appearance of bladder. Air within the anterior bladder, this may be due to recent instrumentation, clinical correlation recommended 5. Small bilateral pleural effusions with dependent atelectasis within both lung bases. Electronically Signed   By: Donavan Foil M.D.   On: 12/18/2016 14:23    Anti-infectives: Anti-infectives    Start     Dose/Rate Route Frequency Ordered Stop   12/18/16 2100  piperacillin-tazobactam (ZOSYN) IVPB 3.375 g     3.375 g 12.5 mL/hr over 240 Minutes Intravenous Every 8 hours 12/18/16 1628     12/18/16 1230  piperacillin-tazobactam (ZOSYN) IVPB 3.375 g     3.375 g 100 mL/hr over 30 Minutes Intravenous  Once 12/18/16 1227 12/18/16 1350   12/18/16 1230  vancomycin (VANCOCIN) IVPB 1000 mg/200 mL premix     1,000 mg 200 mL/hr over 60 Minutes Intravenous  Once 12/18/16 1227 12/18/16 1424      Assessment/Plan: Impression: Intra-abdominal abscess, resolving  leukocytosis. Plan: Patient to go down to IR for percutaneous drainage. Further management pending those results.  LOS: 1 day    Aviva Signs 12/19/2016

## 2016-12-19 NOTE — Care Management Important Message (Signed)
Important Message  Patient Details  Name: Erika Diaz MRN: [id number] Date of Birth: 1946-03-05   Medicare Important Message Given:  Yes    Sherald Barge, RN 12/19/2016, 12:32 PM

## 2016-12-19 NOTE — Sedation Documentation (Signed)
[facility] notified for transport back to [facility]

## 2016-12-20 LAB — BASIC METABOLIC PANEL
ANION GAP: 7 (ref 5–15)
BUN: 6 mg/dL (ref 6–20)
CO2: 22 mmol/L (ref 22–32)
Calcium: 7.7 mg/dL — ABNORMAL LOW (ref 8.9–10.3)
Chloride: 105 mmol/L (ref 101–111)
Creatinine, Ser: 0.66 mg/dL (ref 0.44–1.00)
GFR calc Af Amer: >60 mL/min (ref >60)
GLUCOSE: 131 mg/dL — AB (ref 65–99)
POTASSIUM: 3.3 mmol/L — AB (ref 3.5–5.1)
SODIUM: 134 mmol/L — AB (ref 135–145)

## 2016-12-20 LAB — CBC
HCT: 22 % — ABNORMAL LOW (ref 36.0–46.0)
Hemoglobin: 7.2 g/dL — ABNORMAL LOW (ref 12.0–15.0)
MCH: 24.3 pg — ABNORMAL LOW (ref 26.0–34.0)
MCHC: 32.7 g/dL (ref 30.0–36.0)
MCV: 74.3 fL — AB (ref 78.0–100.0)
PLATELETS: 382 10*3/uL (ref 150–400)
RBC: 2.96 MIL/uL — AB (ref 3.87–5.11)
RDW: 17.1 % — ABNORMAL HIGH (ref 11.5–15.5)
WBC: 12.5 10*3/uL — AB (ref 4.0–10.5)

## 2016-12-20 MED ORDER — FLEET ENEMA 7-19 GM/118ML RE ENEM
1.0000 | ENEMA | Freq: Once | RECTAL | Status: AC
  Administered 2016-12-20: 1 via RECTAL

## 2016-12-20 NOTE — Progress Notes (Signed)
Fleet enema performed. Pt had quite a bit of mucus/bloody discharge from anus. MD made aware. Will continue to monitor.

## 2016-12-20 NOTE — Progress Notes (Signed)
Drain system emptied, 15 ml recorded. Drainage had a yellow color with mucus- like shreds in it. Will continue to monitor.

## 2016-12-20 NOTE — Progress Notes (Signed)
Subjective: Patient feels fine and states she wants to go home.  Objective: Vital signs in last 24 hours: Temp:  [98.3 F (36.8 C)-98.8 F (37.1 C)] 98.3 F (36.8 C) (07/28 0600) Pulse Rate:  [73-88] 88 (07/28 0600) Resp:  [15-20] 20 (07/28 0600) BP: (96-120)/(42-66) 108/54 (07/28 0600) SpO2:  [92 %-100 %] 97 % (07/28 0600) Weight:  [125 lb (56.7 kg)] 125 lb (56.7 kg) (07/27 1100) Last BM Date: 12/20/16  Intake/Output from previous day: 07/27 0701 - 07/28 0700 In: 2168.8 [P.O.:240; I.V.:1678.8; IV Piggyback:250] Out: 4765 [Urine:1100; Drains:5; Stool:250] Intake/Output this shift: No intake/output data recorded.  General appearance: alert, cooperative and no distress Resp: clear to auscultation bilaterally Cardio: regular rate and rhythm, S1, S2 normal, no murmur, click, rub or gallop GI: Soft. Colostomy pink and patent. No significant drainage from percutaneous tube.  Lab Results:   Recent Labs  12/19/16 0619 12/20/16 0951  WBC 20.0* 12.5*  HGB 8.1* 7.2*  HCT 24.9* 22.0*  PLT 400 382   BMET  Recent Labs  12/18/16 1123 12/19/16 0619  NA 129* 131*  K 3.1* 2.3*  CL 95* 102  CO2 24 22  GLUCOSE 111* 75  BUN 5* 6  CREATININE 0.67 0.78  CALCIUM 8.2* 7.2*   PT/INR  Recent Labs  12/18/16 1131  LABPROT 13.9  INR 1.07    Studies/Results: Dg Chest 2 View  Result Date: 12/18/2016 CLINICAL DATA:  Generalize weakening, rectal bleeding, normal appetite. Ten days postoperative from colonic resection with colostomy creation. EXAM: CHEST  2 VIEW COMPARISON:  Chest x-ray of December 08, 2016 FINDINGS: The lungs are reasonably well inflated. There is increased density in the right lower lobe posteriorly. There is no pleural effusion. The heart is top-normal in size. The pulmonary vascularity is normal. The observed bony thorax exhibits no acute abnormality. IMPRESSION: Right lower lobe atelectasis or pneumonia.  No CHF. Electronically Signed   By: David  Martinique M.D.    On: 12/18/2016 14:02   Ct Abdomen Pelvis W Contrast  Result Date: 12/18/2016 CLINICAL DATA:  Generalize weakness with rectal bleeding, post op colostomy, history of colon cancer EXAM: CT ABDOMEN AND PELVIS WITH CONTRAST TECHNIQUE: Multidetector CT imaging of the abdomen and pelvis was performed using the standard protocol following bolus administration of intravenous contrast. CONTRAST:  138mL ISOVUE-300 IOPAMIDOL (ISOVUE-300) INJECTION 61% COMPARISON:  12/08/2016 FINDINGS: Lower chest: Small bilateral pleural effusions. Hazy atelectasis at the bilateral lung bases. Borderline cardiomegaly. Mild coronary calcification. Hepatobiliary: Probable focal fat infiltration at the falciform ligament. subcentimeter hypodensity inferior right hepatic lobe too small to further characterize. No calcified gallstones or biliary dilatation. Pancreas: Unremarkable. No pancreatic ductal dilatation or surrounding inflammatory changes. Spleen: Normal in size without focal abnormality. Adrenals/Urinary Tract: Adrenal glands are within normal limits. subcentimeter hypodensity right kidney too small to further characterize. Prominent right extrarenal pelvis. Air within the urinary bladder. Bladder slightly thick-walled. Stomach/Bowel: Stomach is nonenlarged. Small esophageal hiatal hernia. Residual borderline enlarged small bowel left upper quadrant up to 3 cm with overall decreased small bowel dilatation compared to prior CT. Interval creation of left abdominal colostomy with partial resection of sigmoid colon. Slight thickening of the rectum. Loop of bowel in the low pelvis with mucosal enhancement, appears to be small bowel. Vascular/Lymphatic: Aortic atherosclerosis. No aneurysmal dilatation. No significantly enlarged lymph nodes. Reproductive: Uterus and bilateral adnexa are unremarkable. Other: Small pockets of pneumoperitoneum within the anterior abdomen, likely relates to resolving postoperative air. Soft tissue thickening in  the left lower quadrant abdominal  wall consistent with recent surgery. Mildly rim enhancing collection in the anterior pelvis slightly to the left of midline measuring 2 x 6.6 cm. Other smaller slightly rim enhancing fluid collections are noted within the pelvis. Musculoskeletal: Degenerative changes. No acute or suspicious bone lesion. IMPRESSION: 1. Status post resection of sigmoid colon with creation of left abdominal colostomy. Few residual foci of pneumoperitoneum fell consistent with resolving postoperative air. Decreasing small bowel dilatation, likely due to resolving bowel obstruction or mild ileus. 2. Multiple slightly rim enhancing fluid collections within the low abdomen and pelvis with largest fluid collection seen anteriorly and measuring up to 6.6 cm, this may represent organizing post- operative collection or developing abscess given history of perforation. 3. Low lying loop of small bowel in the pelvis with prominent mucosal enhancement, similar appearance to prior and may represent inflamed loop of small bowel. 4. Slightly thick-walled appearance of bladder. Air within the anterior bladder, this may be due to recent instrumentation, clinical correlation recommended 5. Small bilateral pleural effusions with dependent atelectasis within both lung bases. Electronically Signed   By: Donavan Foil M.D.   On: 12/18/2016 14:23   Ct Image Guided Drainage By Percutaneous Catheter  Result Date: 12/19/2016 CLINICAL DATA:  Perforated sigmoid carcinoma. Surgical resection with ostomy. Postop CT suggests possible abscess in the left lower quadrant. EXAM: CT GUIDED DRAINAGE OF LEFT LOWER QUADRANT PERITONEAL ABSCESS ANESTHESIA/SEDATION: Intravenous Fentanyl and Versed were administered as conscious sedation during continuous monitoring of the patient's level of consciousness and physiological / cardiorespiratory status by the radiology RN, with a total moderate sedation time of 10 minutes. PROCEDURE: The  procedure, risks, benefits, and alternatives were explained to the patient. Questions regarding the procedure were encouraged and answered. The patient understands and consents to the procedure. Select axial scans through the lower abdomen were obtained. The collection was localized and an appropriate skin entry site was determined and marked. The operative field was prepped with chlorhexidinein a sterile fashion, and a sterile drape was applied covering the operative field. A sterile gown and sterile gloves were used for the procedure. Local anesthesia was provided with 1% Lidocaine. Under CT fluoroscopic guidance, a 19 gauge percutaneous entry lytic like was advanced into the collection. Thin cloudy fluid could be aspirated. An Amplatz guidewire advanced easily within the collection, its position confirmed on CT. Tract dilated to facilitate placement of a 10 French pigtail catheter, formed centrally within the collection. CT confirms appropriate catheter position. Approximately 20 mL of thin purulent fluid were aspirated, sent for Gram stain and culture. The patient tolerated the procedure well. COMPLICATIONS: None immediate FINDINGS: Left lower quadrant peritoneal collection was again localized using comparison to the previous day's contrast-enhanced CT. Ten French pigtail drain catheter placed as above. Sample of the aspirate sent for Gram stain and culture. IMPRESSION: 1. Technically successful CT-guided left lower quadrant peritoneal abscess drain catheter placement. Electronically Signed   By: Lucrezia Europe M.D.   On: 12/19/2016 16:05    Anti-infectives: Anti-infectives    Start     Dose/Rate Route Frequency Ordered Stop   12/18/16 2100  piperacillin-tazobactam (ZOSYN) IVPB 3.375 g     3.375 g 12.5 mL/hr over 240 Minutes Intravenous Every 8 hours 12/18/16 1628     12/18/16 1230  piperacillin-tazobactam (ZOSYN) IVPB 3.375 g     3.375 g 100 mL/hr over 30 Minutes Intravenous  Once 12/18/16 1227 12/18/16  1350   12/18/16 1230  vancomycin (VANCOCIN) IVPB 1000 mg/200 mL premix     1,000  mg 200 mL/hr over 60 Minutes Intravenous  Once 12/18/16 1227 12/18/16 1424      Assessment/Plan: Impression: Intra-abdominal abscess, status post Hartman's procedure for perforated colon cancer. Leukocytosis resolving. Patient still has anemia, but this may be secondary to fluid hydration. Gram stain of abscess shows multiple organisms. We will continue IV Zosyn. No need for blood transfusion.  LOS: 2 days    Aviva Signs 12/20/2016

## 2016-12-21 LAB — BASIC METABOLIC PANEL
Anion gap: 7 (ref 5–15)
CALCIUM: 7.8 mg/dL — AB (ref 8.9–10.3)
CO2: 23 mmol/L (ref 22–32)
Chloride: 103 mmol/L (ref 101–111)
Creatinine, Ser: 0.58 mg/dL (ref 0.44–1.00)
GFR calc Af Amer: >60 mL/min (ref >60)
GLUCOSE: 102 mg/dL — AB (ref 65–99)
POTASSIUM: 3.3 mmol/L — AB (ref 3.5–5.1)
SODIUM: 133 mmol/L — AB (ref 135–145)

## 2016-12-21 LAB — CBC
HEMATOCRIT: 23.7 % — AB (ref 36.0–46.0)
Hemoglobin: 7.7 g/dL — ABNORMAL LOW (ref 12.0–15.0)
MCH: 24.1 pg — ABNORMAL LOW (ref 26.0–34.0)
MCHC: 32.5 g/dL (ref 30.0–36.0)
MCV: 74.3 fL — ABNORMAL LOW (ref 78.0–100.0)
Platelets: 453 10*3/uL — ABNORMAL HIGH (ref 150–400)
RBC: 3.19 MIL/uL — ABNORMAL LOW (ref 3.87–5.11)
RDW: 16.9 % — AB (ref 11.5–15.5)
WBC: 11.3 10*3/uL — ABNORMAL HIGH (ref 4.0–10.5)

## 2016-12-21 LAB — MAGNESIUM: Magnesium: 1.5 mg/dL — ABNORMAL LOW (ref 1.7–2.4)

## 2016-12-21 MED ORDER — POTASSIUM CHLORIDE 10 MEQ/100ML IV SOLN
INTRAVENOUS | Status: AC
  Filled 2016-12-21: qty 500

## 2016-12-21 MED ORDER — POTASSIUM CHLORIDE 10 MEQ/100ML IV SOLN
10.0000 meq | INTRAVENOUS | Status: DC
Start: 2016-12-21 — End: 2016-12-21
  Administered 2016-12-21 (×2): 10 meq via INTRAVENOUS

## 2016-12-21 MED ORDER — MAGNESIUM SULFATE 50 % IJ SOLN
2.0000 g | Freq: Once | INTRAVENOUS | Status: AC
  Administered 2016-12-21: 2 g via INTRAVENOUS
  Filled 2016-12-21: qty 4

## 2016-12-21 MED ORDER — POTASSIUM CHLORIDE 20 MEQ PO PACK
20.0000 meq | PACK | Freq: Two times a day (BID) | ORAL | Status: DC
  Administered 2016-12-21 – 2016-12-22 (×3): 20 meq via ORAL
  Filled 2016-12-21 (×3): qty 1

## 2016-12-21 NOTE — Progress Notes (Signed)
Subjective: Patient feels well. Mild rectal pain after fleets enema.  Objective: Vital signs in last 24 hours: Temp:  [98.2 F (36.8 C)-100 F (37.8 C)] 100 F (37.8 C) (07/29 0500) Pulse Rate:  [71-84] 84 (07/29 0500) Resp:  [20] 20 (07/29 0500) BP: (121-141)/(62-67) 141/67 (07/29 0500) SpO2:  [93 %-96 %] 93 % (07/29 0500) Last BM Date: 12/21/16  Intake/Output from previous day: 07/28 0701 - 07/29 0700 In: 1371.7 [P.O.:360; I.V.:911.7; IV Piggyback:100] Out: 1015 [Urine:1000; Drains:15] Intake/Output this shift: No intake/output data recorded.  General appearance: alert, cooperative and no distress Resp: clear to auscultation bilaterally Cardio: regular rate and rhythm, S1, S2 normal, no murmur, click, rub or gallop GI: Soft, ostomy pink and patent. Incision healing well. Catheter drainage clear.  Lab Results:   Recent Labs  12/20/16 0951 12/21/16 0719  WBC 12.5* 11.3*  HGB 7.2* 7.7*  HCT 22.0* 23.7*  PLT 382 453*   BMET  Recent Labs  12/20/16 0951 12/21/16 0719  NA 134* 133*  K 3.3* 3.3*  CL 105 103  CO2 22 23  GLUCOSE 131* 102*  BUN 6 <5*  CREATININE 0.66 0.58  CALCIUM 7.7* 7.8*   PT/INR No results for input(s): LABPROT, INR in the last 72 hours.  Studies/Results: Ct Image Guided Drainage By Percutaneous Catheter  Result Date: 12/19/2016 CLINICAL DATA:  Perforated sigmoid carcinoma. Surgical resection with ostomy. Postop CT suggests possible abscess in the left lower quadrant. EXAM: CT GUIDED DRAINAGE OF LEFT LOWER QUADRANT PERITONEAL ABSCESS ANESTHESIA/SEDATION: Intravenous Fentanyl and Versed were administered as conscious sedation during continuous monitoring of the patient's level of consciousness and physiological / cardiorespiratory status by the radiology RN, with a total moderate sedation time of 10 minutes. PROCEDURE: The procedure, risks, benefits, and alternatives were explained to the patient. Questions regarding the procedure were  encouraged and answered. The patient understands and consents to the procedure. Select axial scans through the lower abdomen were obtained. The collection was localized and an appropriate skin entry site was determined and marked. The operative field was prepped with chlorhexidinein a sterile fashion, and a sterile drape was applied covering the operative field. A sterile gown and sterile gloves were used for the procedure. Local anesthesia was provided with 1% Lidocaine. Under CT fluoroscopic guidance, a 19 gauge percutaneous entry lytic like was advanced into the collection. Thin cloudy fluid could be aspirated. An Amplatz guidewire advanced easily within the collection, its position confirmed on CT. Tract dilated to facilitate placement of a 10 French pigtail catheter, formed centrally within the collection. CT confirms appropriate catheter position. Approximately 20 mL of thin purulent fluid were aspirated, sent for Gram stain and culture. The patient tolerated the procedure well. COMPLICATIONS: None immediate FINDINGS: Left lower quadrant peritoneal collection was again localized using comparison to the previous day's contrast-enhanced CT. Ten French pigtail drain catheter placed as above. Sample of the aspirate sent for Gram stain and culture. IMPRESSION: 1. Technically successful CT-guided left lower quadrant peritoneal abscess drain catheter placement. Electronically Signed   By: Lucrezia Europe M.D.   On: 12/19/2016 16:05    Anti-infectives: Anti-infectives    Start     Dose/Rate Route Frequency Ordered Stop   12/18/16 2100  piperacillin-tazobactam (ZOSYN) IVPB 3.375 g     3.375 g 12.5 mL/hr over 240 Minutes Intravenous Every 8 hours 12/18/16 1628     12/18/16 1230  piperacillin-tazobactam (ZOSYN) IVPB 3.375 g     3.375 g 100 mL/hr over 30 Minutes Intravenous  Once 12/18/16 1227  12/18/16 1350   12/18/16 1230  vancomycin (VANCOCIN) IVPB 1000 mg/200 mL premix     1,000 mg 200 mL/hr over 60 Minutes  Intravenous  Once 12/18/16 1227 12/18/16 1424      Assessment/Plan: Impression: Intra-abdominal abscess, resolving. Hypokalemia, slowly resolving. Hypomagnesemia still present. Plan: Continue IV Zosyn. Will treat hypokalemia and hypomagnesemia. Anticipate discharge in next 24-48 hours.  LOS: 3 days    Aviva Signs 12/21/2016

## 2016-12-21 NOTE — Progress Notes (Signed)
Peritoneal drainage catheter flushed with 5ml NS.  Gas relieved from Colostomy bag Dressing clean dry and intact surrounding peritoneal drainage catheter.  Margaret Pyle, RN

## 2016-12-22 ENCOUNTER — Encounter: Payer: Self-pay | Admitting: General Surgery

## 2016-12-22 LAB — BASIC METABOLIC PANEL
Anion gap: 9 (ref 5–15)
BUN: <5 mg/dL — ABNORMAL LOW (ref 6–20)
CHLORIDE: 103 mmol/L (ref 101–111)
CO2: 23 mmol/L (ref 22–32)
CREATININE: 0.59 mg/dL (ref 0.44–1.00)
Calcium: 7.9 mg/dL — ABNORMAL LOW (ref 8.9–10.3)
GFR calc non Af Amer: >60 mL/min (ref >60)
GLUCOSE: 94 mg/dL (ref 65–99)
Potassium: 3.5 mmol/L (ref 3.5–5.1)
Sodium: 135 mmol/L (ref 135–145)

## 2016-12-22 LAB — CBC
HEMATOCRIT: 24.7 % — AB (ref 36.0–46.0)
Hemoglobin: 8 g/dL — ABNORMAL LOW (ref 12.0–15.0)
MCH: 24.1 pg — ABNORMAL LOW (ref 26.0–34.0)
MCHC: 32.4 g/dL (ref 30.0–36.0)
MCV: 74.4 fL — AB (ref 78.0–100.0)
Platelets: 460 10*3/uL — ABNORMAL HIGH (ref 150–400)
RBC: 3.32 MIL/uL — AB (ref 3.87–5.11)
RDW: 16.9 % — AB (ref 11.5–15.5)
WBC: 10.4 10*3/uL (ref 4.0–10.5)

## 2016-12-22 LAB — MAGNESIUM: Magnesium: 1.8 mg/dL (ref 1.7–2.4)

## 2016-12-22 MED ORDER — POTASSIUM CHLORIDE 20 MEQ PO PACK
20.0000 meq | PACK | Freq: Every day | ORAL | 0 refills | Status: DC
Start: 2016-12-22 — End: 2016-12-22

## 2016-12-22 MED ORDER — HYDROCODONE-ACETAMINOPHEN 7.5-325 MG/15ML PO SOLN: 15.0000 mL | Freq: Four times a day (QID) | ORAL | 0 refills | Status: AC | PRN

## 2016-12-22 MED ORDER — AMOXICILLIN-POT CLAVULANATE 250-62.5 MG/5ML PO SUSR: 250.0000 mg | Freq: Two times a day (BID) | ORAL | 0 refills | Status: AC

## 2016-12-22 MED ORDER — POTASSIUM CHLORIDE 20 MEQ PO PACK
20.0000 meq | PACK | Freq: Every day | ORAL | 0 refills | Status: DC
Start: 2016-12-22 — End: 2023-04-01

## 2016-12-22 NOTE — Care Management Note (Signed)
Case Management Note  Patient Details  Name: Erika Diaz MRN: [id number] Date of Birth: 01-Dec-1945  Expected Discharge Date:  12/22/16               Expected Discharge Plan:  [place]  In-House Referral:  NA  Discharge planning Services  CM Consult  Post Acute Care Choice:  Home Health Choice offered to:  Patient  HH Arranged:  RN, PT [facility] Agency:  [place]  Status of Service:  Completed, signed off  Additional Comments: Pt discharging home today with resumption of [facility] services. PT added to services due to pt's weakened state. Pt aware HH has 48hrs to resume services. [facility] rep, Vaughan Basta, aware of DC plan and will obtain info from chart. Pt has RW to use with ambulation at home.   Sherald Barge, RN 12/22/2016, 1:17 PM

## 2016-12-22 NOTE — Care Management Important Message (Signed)
Important Message  Patient Details  Name: Erika Diaz MRN: [id number] Date of Birth: January 15, 1946   Medicare Important Message Given:  Yes    Sherald Barge, RN 12/22/2016, 1:16 PM

## 2016-12-22 NOTE — Progress Notes (Signed)
Patient is to be discharged home and in stable condition. Patient ambulated in hallway, tolerated well. Patient given discharge instructions and verbalized understanding. All questions and concerns addressed prior to discharge. Patient escorted out via wheelchair by staff.  Celestia Khat, RN

## 2016-12-22 NOTE — Discharge Summary (Signed)
Physician Discharge Summary  Patient ID: Erika Diaz MRN: [id number] DOB/AGE: May 25, 1946 71 y.o.  Admit date: 12/18/2016 Discharge date: 12/22/2016  Admission Diagnoses:Intra-abdominal abscess, status post Hartman's procedure, colon cancer  Discharge Diagnoses: Same Active Problems:   Peritoneal abscess ([place]) Hypokalemia, hypomagnesemia  Discharged Condition: good  Hospital Course: Patient is a 71 year old white female status post emergent Hartman's procedure for perforated colon cancer who presented on 12/18/2016 with generalized malaise. She was noted to have a leukocytosis. A CT scan of the abdomen revealed an intra-abdominal fluid collection. She was admitted to the hospital for IV antibiotics. The following day, she underwent percutaneous drainage of the abscess by CT guidance. She tolerated the procedure well. Final cultures revealed Escherichia coli. Her leukocytosis subsequently normalized. She was treated for hypokalemia and hypomagnesemia. The drain has not put out any further fluid. It was removed. She is being discharged home in good and improving condition.  Significant Diagnostic Studies: CT scan of abdomen  Treatments: procedures: percutaneous drainage catheter placement  Discharge Exam: Blood pressure 140/62, pulse 82, temperature 98.9 F (37.2 C), temperature source Oral, resp. rate 18, height 5\' 4"  (1.626 m), weight 125 lb (56.7 kg), SpO2 95 %. General appearance: alert, cooperative and no distress Resp: clear to auscultation bilaterally Cardio: regular rate and rhythm, S1, S2 normal, no murmur, click, rub or gallop GI: Soft, incision healing well. Staples removed, Steri-Strips applied. Drain removed. Ostomy pink and patent.  Disposition: 06-Home-Health Care Svc  Discharge Instructions    Diet - low sodium heart healthy    Complete by:  As directed    Increase activity slowly    Complete by:  As directed      Allergies as of 12/22/2016   No Known Allergies      Medication List    STOP taking these medications   amoxicillin-clavulanate 875-125 MG tablet Commonly known as:  AUGMENTIN Replaced by:  amoxicillin-clavulanate 250-62.5 MG/5ML suspension     TAKE these medications   amoxicillin-clavulanate 250-62.5 MG/5ML suspension Commonly known as:  AUGMENTIN Take 5 mLs (250 mg total) by mouth 2 (two) times daily. Replaces:  amoxicillin-clavulanate 875-125 MG tablet   atenolol 50 MG tablet Commonly known as:  TENORMIN Take 1 tablet (50 mg total) by mouth daily.   HYDROcodone-acetaminophen 7.5-325 mg/15 ml solution Commonly known as:  HYCET Take 15 mLs by mouth 4 (four) times daily as needed for moderate pain.   potassium chloride 20 MEQ packet Commonly known as:  KLOR-CON Take 20 mEq by mouth daily.   promethazine 25 MG tablet Commonly known as:  PHENERGAN Take 0.5-1 tablets by mouth every 6 (six) hours as needed for nausea.      Follow-up Information    [facility], [facility] Follow [facility].   Contact information: [place] [address] [phone]        Medicine, [facility] .   Specialty:  Family Medicine       Aviva Signs, MD. Schedule an appointment as soon as possible for a visit on 01/06/2017.   Specialty:  General Surgery Contact information: [address] [place] [address] [phone]           Signed: Aviva Signs 12/22/2016, 7:31 AM

## 2016-12-23 LAB — CULTURE, BLOOD (ROUTINE X 2)
CULTURE: NO GROWTH
CULTURE: NO GROWTH

## 2016-12-24 LAB — AEROBIC/ANAEROBIC CULTURE W GRAM STAIN (SURGICAL/DEEP WOUND)

## 2016-12-24 LAB — AEROBIC/ANAEROBIC CULTURE (SURGICAL/DEEP WOUND)

## 2017-01-06 ENCOUNTER — Encounter: Payer: Self-pay | Admitting: General Surgery

## 2017-01-06 ENCOUNTER — Ambulatory Visit (INDEPENDENT_AMBULATORY_CARE_PROVIDER_SITE_OTHER): Payer: Self-pay | Admitting: General Surgery

## 2017-01-06 VITALS — BP 110/59 | HR 98 | Temp 96.9°F | Resp 18 | Ht 63.0 in | Wt 107.0 lb

## 2017-01-06 DIAGNOSIS — Z09 Encounter for follow-up examination after completed treatment for conditions other than malignant neoplasm: Secondary | ICD-10-CM

## 2017-01-06 MED ORDER — MEGESTROL ACETATE 400 MG/10ML PO SUSP: 400.0000 mg | Freq: Two times a day (BID) | ORAL | 2 refills | Status: DC

## 2017-01-06 NOTE — Progress Notes (Signed)
Subjective:     Erika Diaz  Status post Hartman's procedure. The patient's appetite is still decreased. She is moving her bowels and denies any emesis. No fever or chills have been noted. She does feel weak. Objective:    BP (!) 110/59   Pulse 98   Temp (!) 96.9 F (36.1 C)   Resp 18   Ht 5\' 3"  (1.6 m)   Wt 107 lb (48.5 kg)   BMI 18.95 kg/m   General:  alert, cooperative and fatigued  Abdomen is soft, incision well healed. Colostomy pink and patent.     Assessment:    Still with decreased appetite and weight loss. No overt surgical issues present.    Plan:   We will start Megace for her appetite. Will refer to oncology for further evaluation and treatment.

## 2017-01-08 DIAGNOSIS — C187 Malignant neoplasm of sigmoid colon: Secondary | ICD-10-CM

## 2017-01-08 HISTORY — DX: Malignant neoplasm of sigmoid colon: C18.7

## 2017-01-13 ENCOUNTER — Encounter: Payer: Medicare HMO | Admitting: Internal Medicine

## 2017-01-29 ENCOUNTER — Ambulatory Visit (HOSPITAL_COMMUNITY): Payer: Medicare HMO

## 2017-02-11 DIAGNOSIS — D509 Iron deficiency anemia, unspecified: Secondary | ICD-10-CM

## 2017-02-11 HISTORY — DX: Iron deficiency anemia, unspecified: D50.9

## 2017-02-25 DIAGNOSIS — K6289 Other specified diseases of anus and rectum: Secondary | ICD-10-CM

## 2017-02-25 HISTORY — DX: Other specified diseases of anus and rectum: K62.89

## 2017-09-21 DIAGNOSIS — E876 Hypokalemia: Secondary | ICD-10-CM | POA: Insufficient documentation

## 2019-01-14 IMAGING — DX DG CHEST 2V
3 series · 3 of 3 positions shown · non-contrast
Comparison: Chest x-ray of December 08, 2016

CLINICAL DATA: Generalize weakening, rectal bleeding, normal
appetite. Ten days postoperative from colonic resection with
colostomy creation.

EXAM:
CHEST  2 VIEW

[chest lat (1 of 2)]
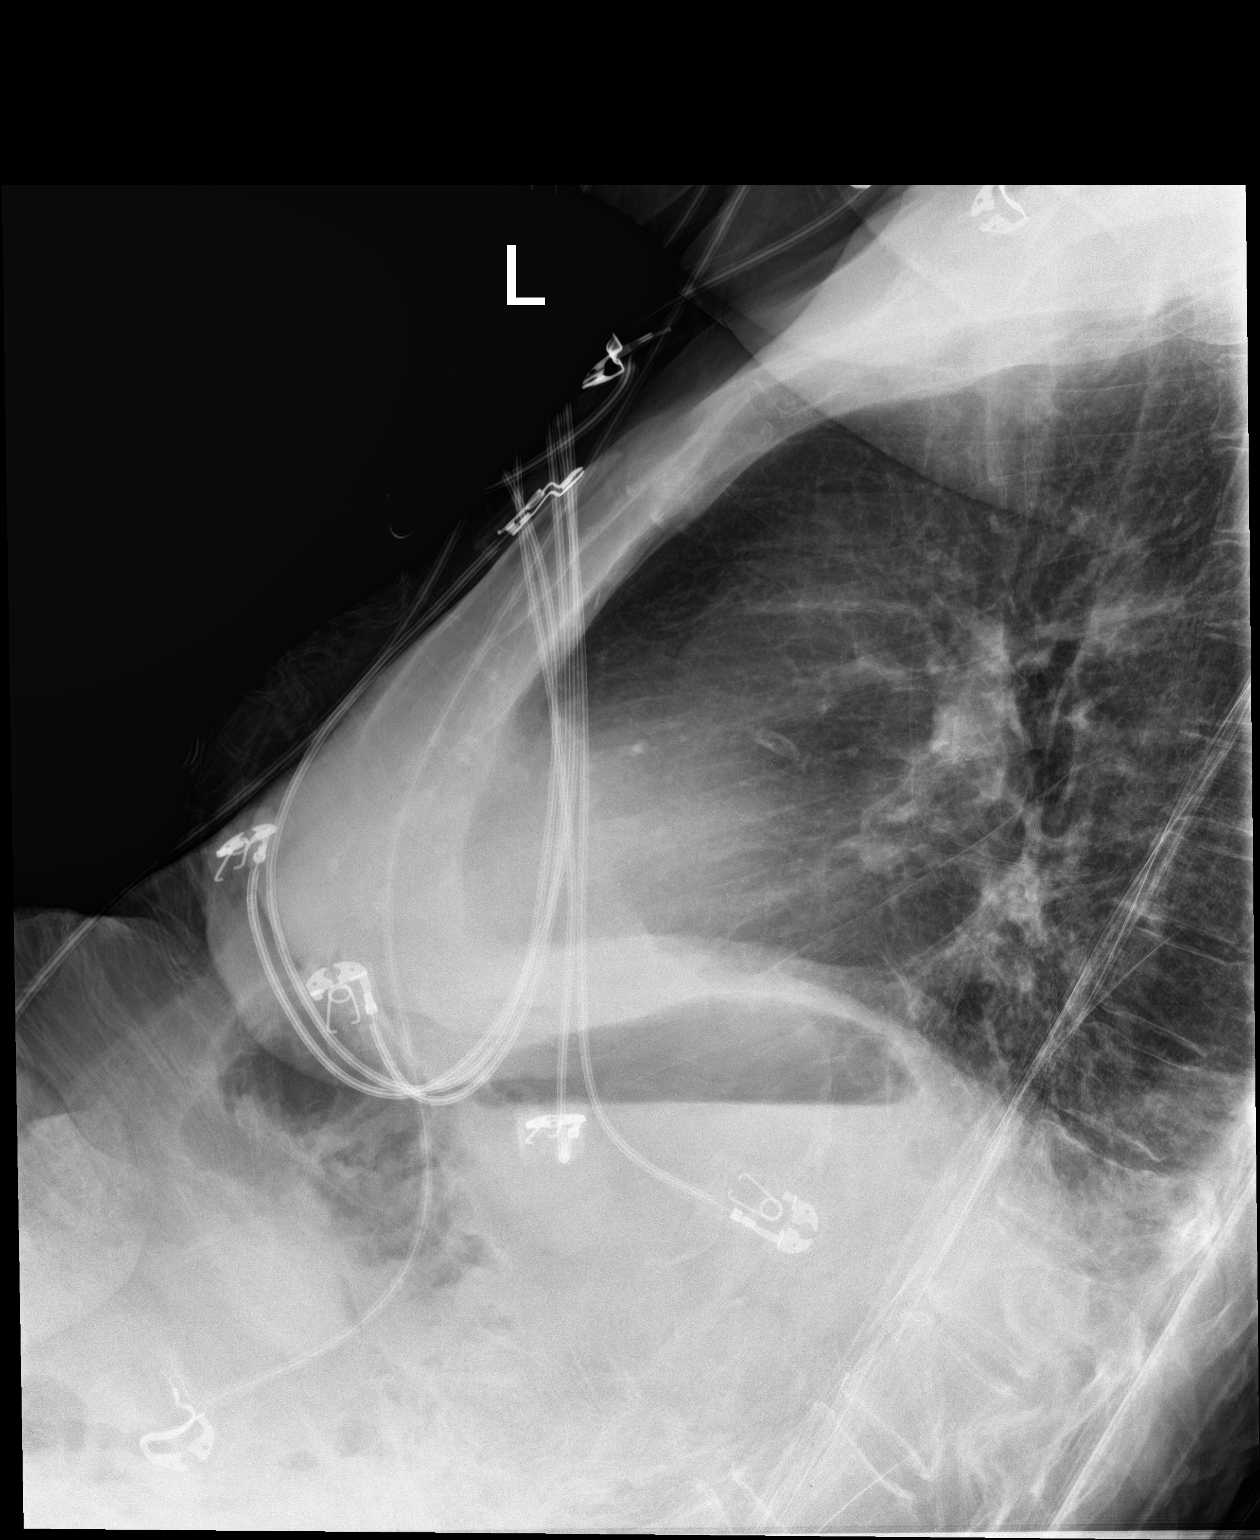

[chest ap]
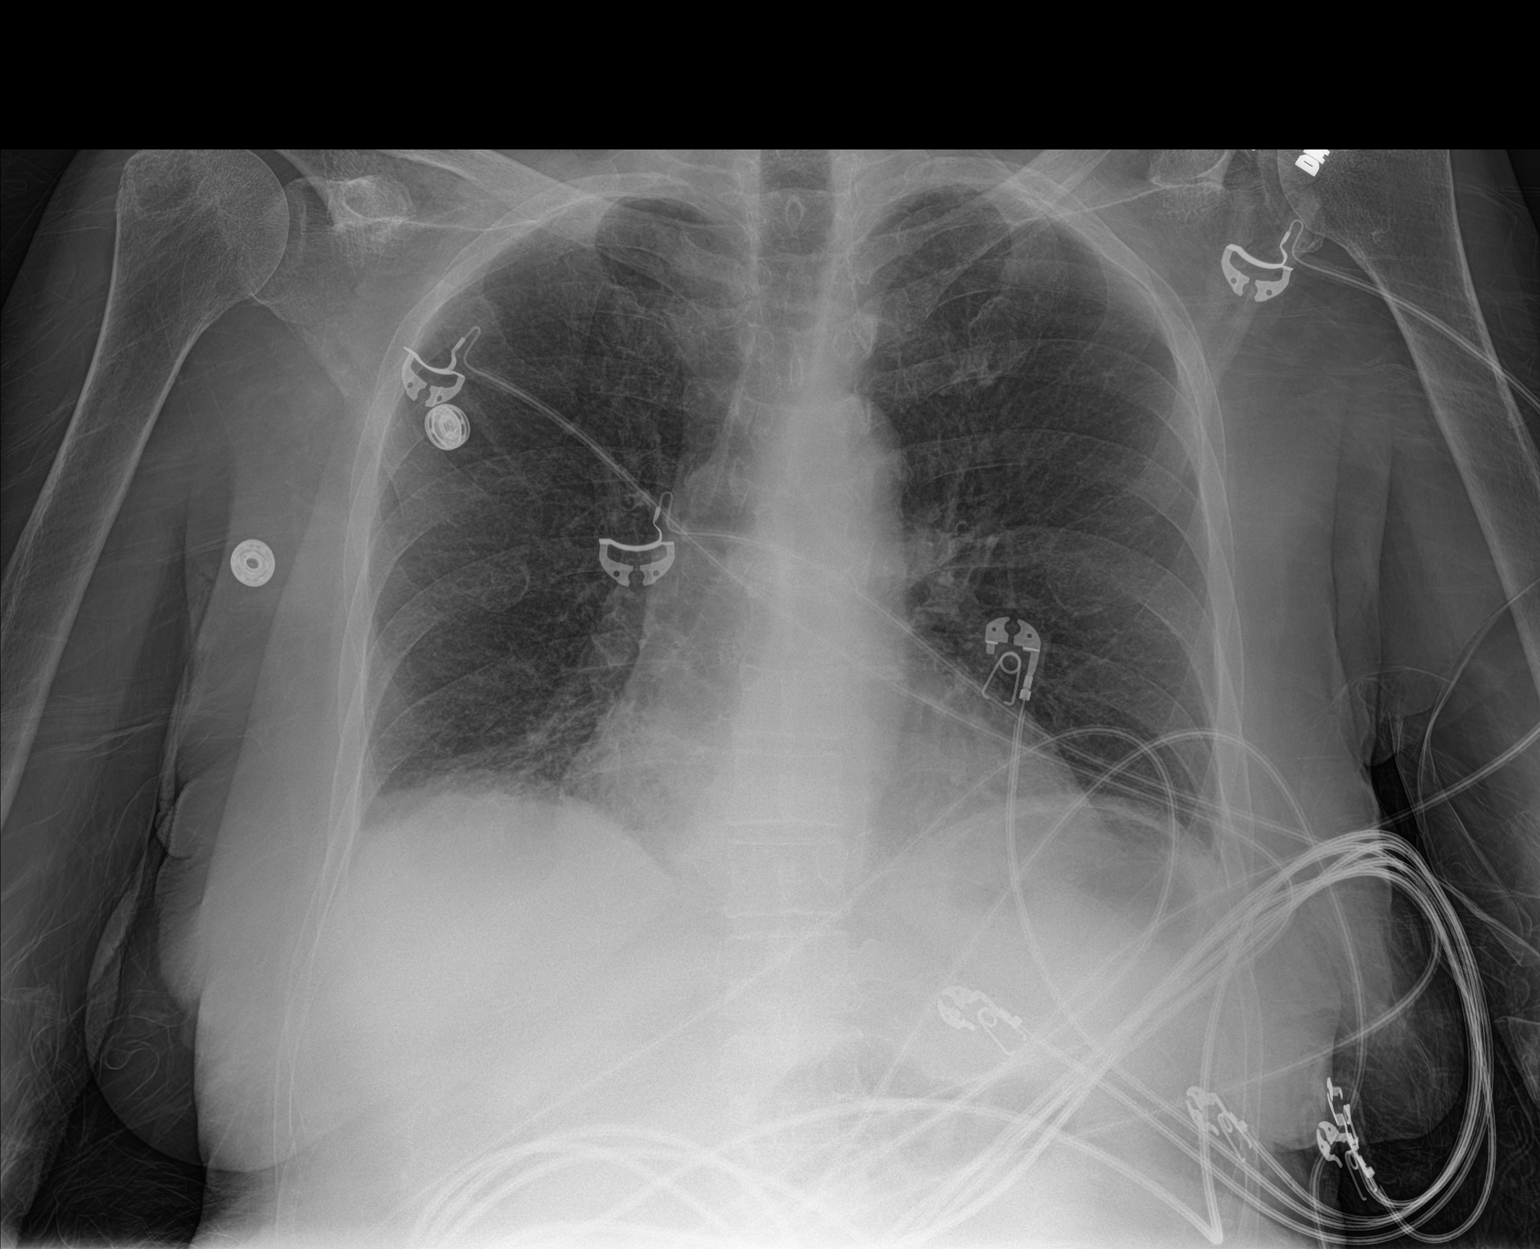

[chest lat (2 of 2)]
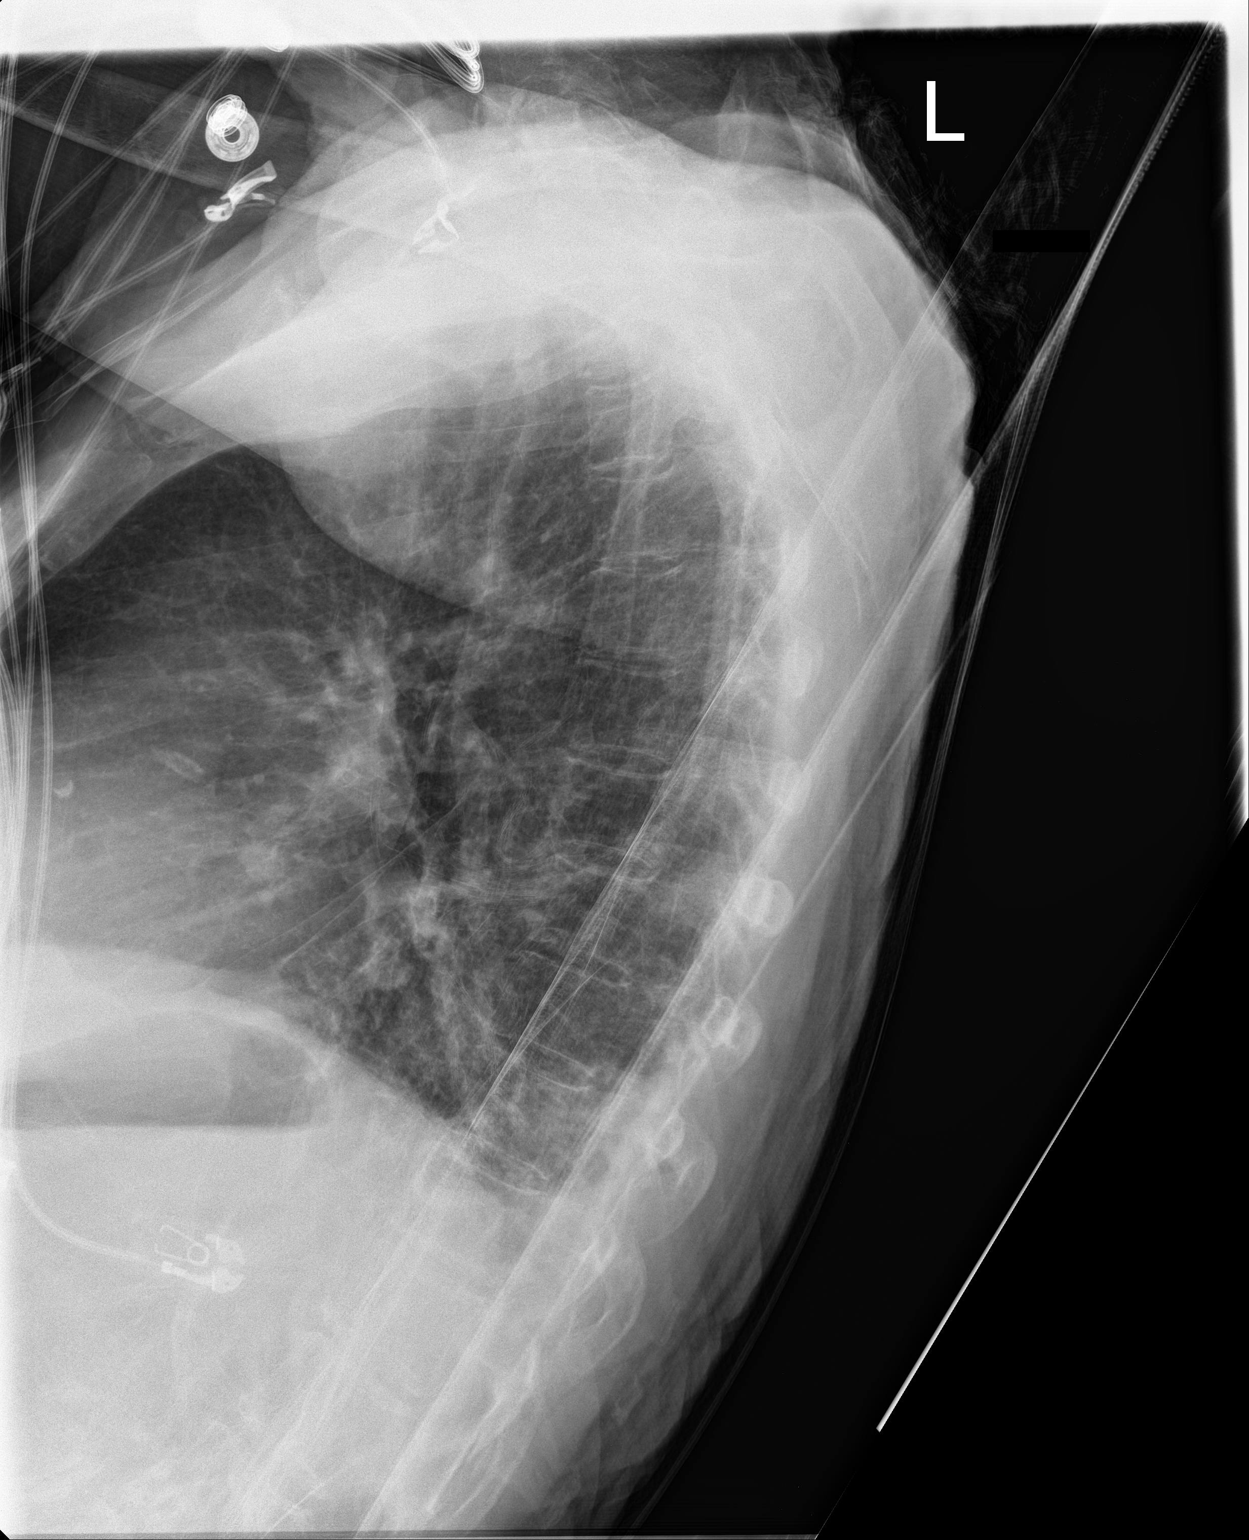

[3 of 3 positions shown; findings below may reference images not displayed]

FINDINGS: The lungs are reasonably well inflated. There is increased density
in the right lower lobe posteriorly. There is no pleural effusion.
The heart is top-normal in size. The pulmonary vascularity is
normal. The observed bony thorax exhibits no acute abnormality.
IMPRESSION: Right lower lobe atelectasis or pneumonia.  No CHF.

## 2019-01-15 IMAGING — CT CT IMAGE GUIDED DRAINAGE BY PERCUTANEOUS CATHETER
1 of 2 series · 15 of 32 positions shown, 19 images · non-contrast
Comparison: none

CLINICAL DATA: Perforated sigmoid carcinoma. Surgical resection
with ostomy. Postop CT suggests possible abscess in the left lower
quadrant.

[Series 2: i-spiral 5.0 b40f · axial · 0.93mm/px · z∈[+759,+938]mm · 15 of 57 slices shown, 19 images]
[im 3/57  soft-tissue]
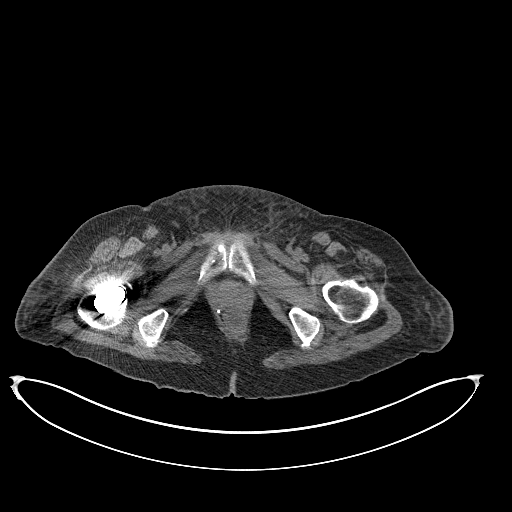
[im 3/57  bone]
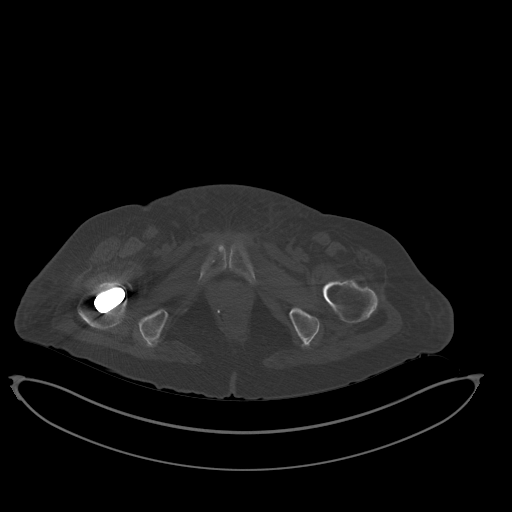
[im 8/57  soft-tissue]
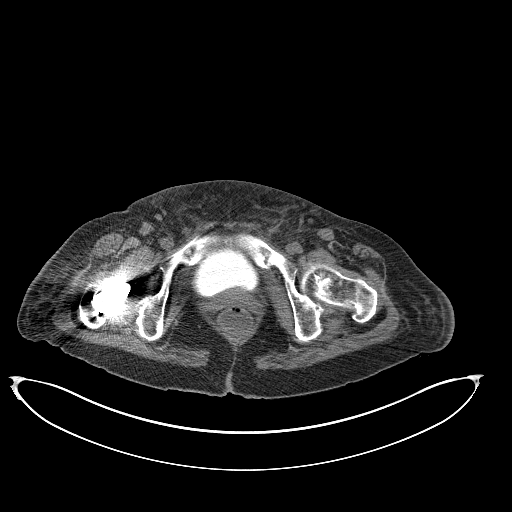
[im 12/57  soft-tissue]
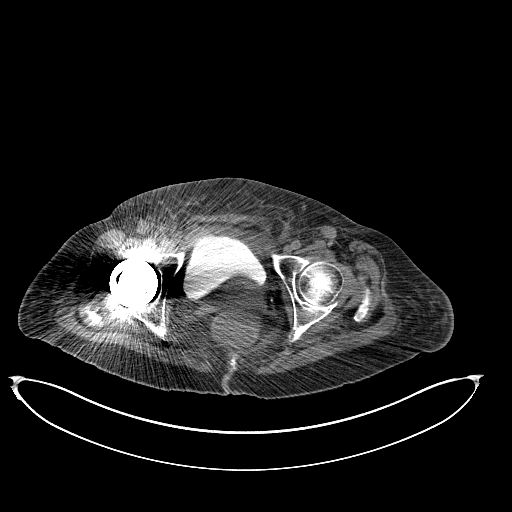
[im 17/57  soft-tissue]
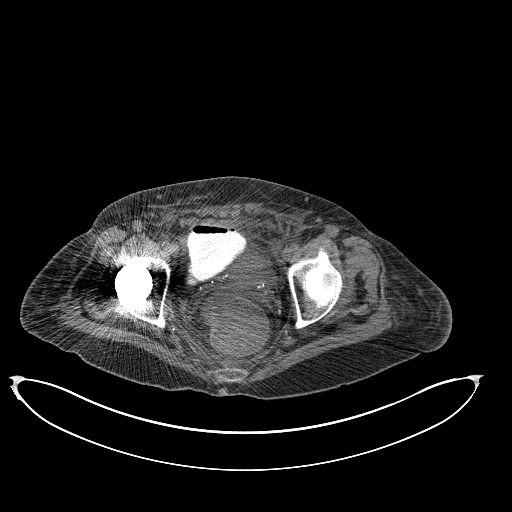
[im 19/57  soft-tissue]
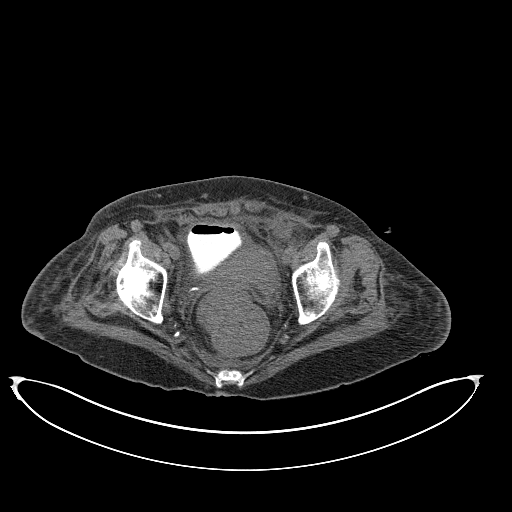
[im 24/57  soft-tissue]
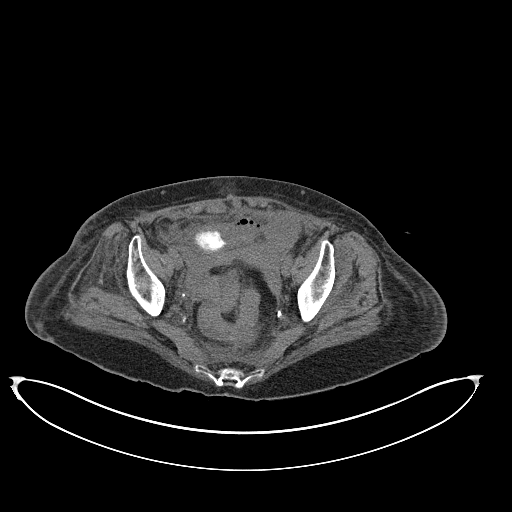
[im 29/57  soft-tissue]
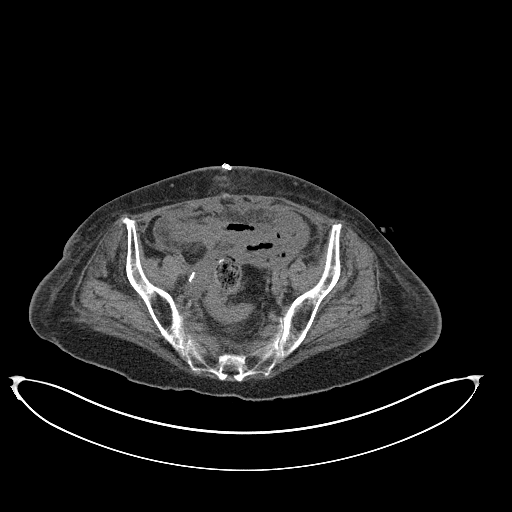
[im 33/57  soft-tissue]
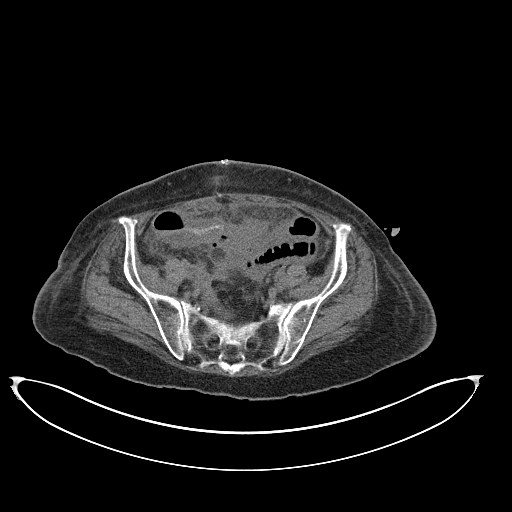
[im 38/57  soft-tissue]
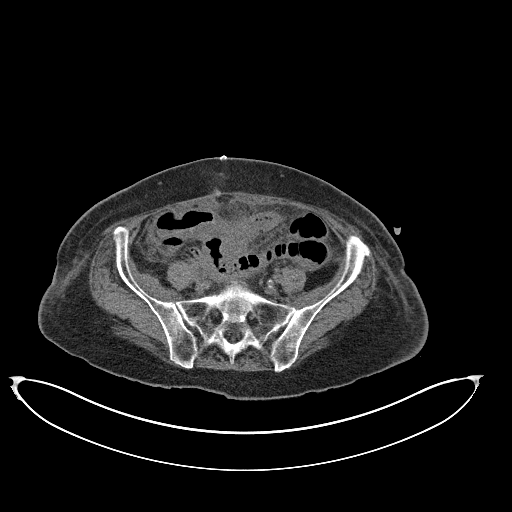
[im 38/57  bone]
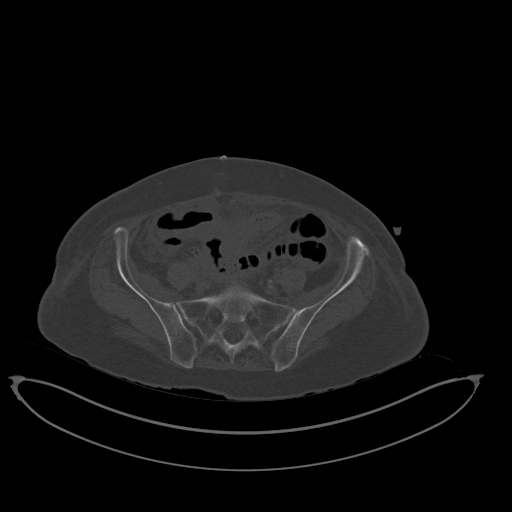
[im 40/57  soft-tissue]
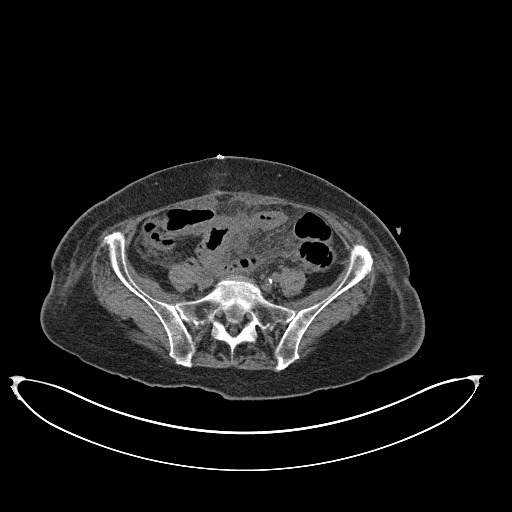
[im 45/57  soft-tissue]
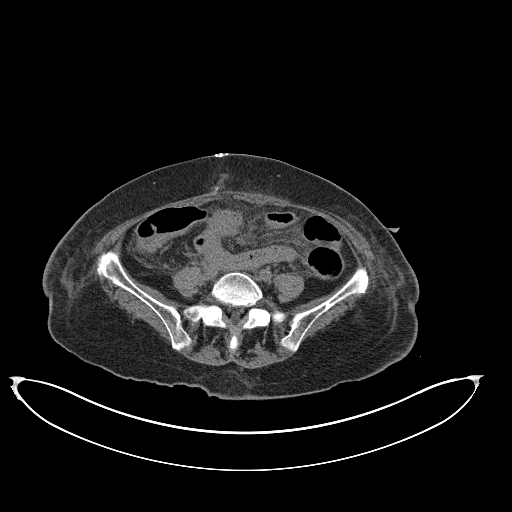
[im 47/57  lung]
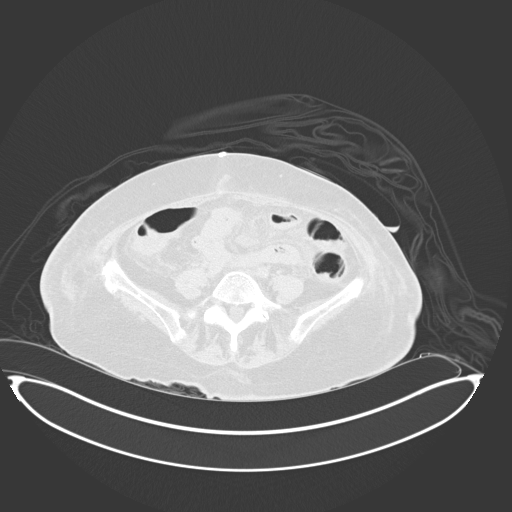
[im 50/57  soft-tissue]
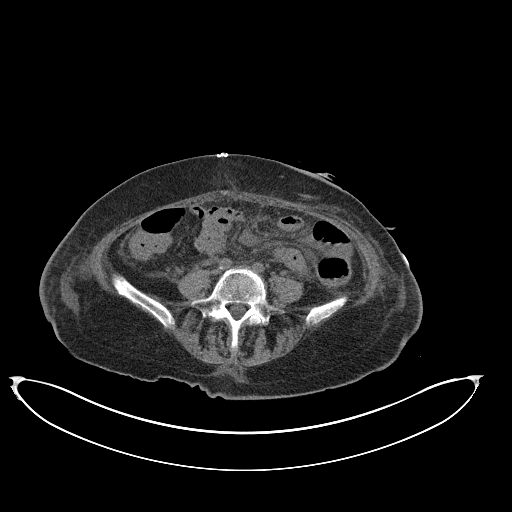
[im 50/57  lung]
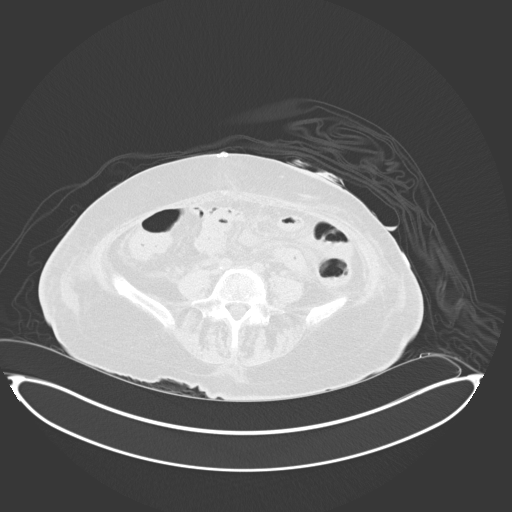
[im 52/57  lung]
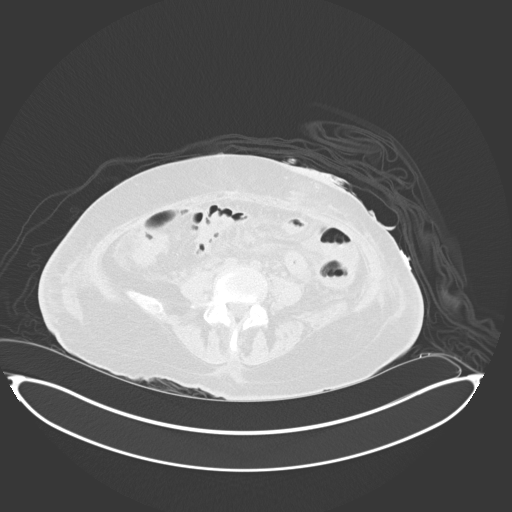
[im 54/57  soft-tissue]
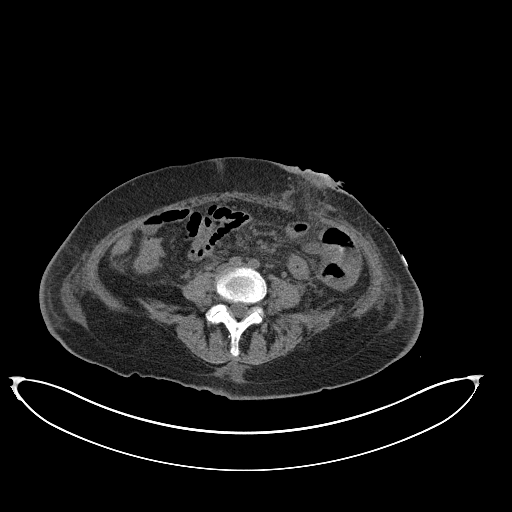
[im 54/57  lung]
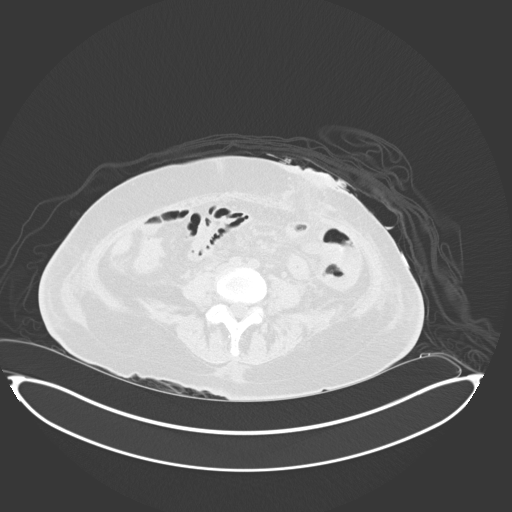

[15 of 32 positions shown; findings below may reference images not displayed]

EXAM:
CT GUIDED DRAINAGE OF LEFT LOWER QUADRANT PERITONEAL ABSCESS

ANESTHESIA/SEDATION:
Intravenous Fentanyl and Versed were administered as conscious
sedation during continuous monitoring of the patient's level of
consciousness and physiological / cardiorespiratory status by the
radiology RN, with a total moderate sedation time of 10 minutes.

PROCEDURE:
The procedure, risks, benefits, and alternatives were explained to
the patient. Questions regarding the procedure were encouraged and
answered. The patient understands and consents to the procedure.

Select axial scans through the lower abdomen were obtained. The
collection was localized and an appropriate skin entry site was
determined and marked.

The operative field was prepped with chlorhexidinein a sterile
fashion, and a sterile drape was applied covering the operative
field. A sterile gown and sterile gloves were used for the
procedure. Local anesthesia was provided with 1% Lidocaine.

Under CT fluoroscopic guidance, a 19 gauge percutaneous entry lytic
like was advanced into the collection. Thin cloudy fluid could be
aspirated. An Amplatz guidewire advanced easily within the
collection, its position confirmed on CT. Tract dilated to
facilitate placement of a 10 French pigtail catheter, formed
centrally within the collection. CT confirms appropriate catheter
position. Approximately 20 mL of thin purulent fluid were aspirated,
sent for Gram stain and culture. The patient tolerated the procedure
well.

COMPLICATIONS:
None immediate
FINDINGS: Left lower quadrant peritoneal collection was again localized using
comparison to the previous day's contrast-enhanced CT. Ten French
pigtail drain catheter placed as above. Sample of the aspirate sent
for Gram stain and culture.
IMPRESSION: 1. Technically successful CT-guided left lower quadrant peritoneal
abscess drain catheter placement.

## 2020-11-19 ENCOUNTER — Other Ambulatory Visit: Payer: Self-pay | Admitting: Hematology and Oncology

## 2023-04-01 ENCOUNTER — Other Ambulatory Visit: Payer: Self-pay

## 2023-04-01 ENCOUNTER — Emergency Department (HOSPITAL_COMMUNITY)
Admission: EM | Admit: 2023-04-01 | Discharge: 2023-04-02 | Disposition: A | Discharge status: Home / Self Care | Payer: Medicare HMO

## 2023-04-01 ENCOUNTER — Emergency Department (HOSPITAL_COMMUNITY): Payer: Medicare HMO

## 2023-04-01 ENCOUNTER — Encounter (HOSPITAL_COMMUNITY): Payer: Self-pay

## 2023-04-01 DIAGNOSIS — I10 Essential (primary) hypertension: Secondary | ICD-10-CM | POA: Diagnosis not present

## 2023-04-01 DIAGNOSIS — I447 Left bundle-branch block, unspecified: Secondary | ICD-10-CM | POA: Diagnosis not present

## 2023-04-01 DIAGNOSIS — M25512 Pain in left shoulder: Secondary | ICD-10-CM | POA: Diagnosis present

## 2023-04-01 DIAGNOSIS — W010XXA Fall on same level from slipping, tripping and stumbling without subsequent striking against object, initial encounter: Secondary | ICD-10-CM | POA: Insufficient documentation

## 2023-04-01 DIAGNOSIS — Z7901 Long term (current) use of anticoagulants: Secondary | ICD-10-CM | POA: Diagnosis not present

## 2023-04-01 DIAGNOSIS — D72829 Elevated white blood cell count, unspecified: Secondary | ICD-10-CM | POA: Insufficient documentation

## 2023-04-01 DIAGNOSIS — M19012 Primary osteoarthritis, left shoulder: Secondary | ICD-10-CM | POA: Insufficient documentation

## 2023-04-01 DIAGNOSIS — Z79899 Other long term (current) drug therapy: Secondary | ICD-10-CM | POA: Insufficient documentation

## 2023-04-01 DIAGNOSIS — R000 Tachycardia, unspecified: Secondary | ICD-10-CM | POA: Insufficient documentation

## 2023-04-01 DIAGNOSIS — I4891 Unspecified atrial fibrillation: Secondary | ICD-10-CM | POA: Diagnosis not present

## 2023-04-01 DIAGNOSIS — S42025A Nondisplaced fracture of shaft of left clavicle, initial encounter for closed fracture: Secondary | ICD-10-CM | POA: Diagnosis not present

## 2023-04-01 HISTORY — DX: Perforation of intestine (nontraumatic): K63.1

## 2023-04-01 LAB — MAGNESIUM: Magnesium: 1.7 mg/dL (ref 1.7–2.4)

## 2023-04-01 LAB — COMPREHENSIVE METABOLIC PANEL
ALT: 23 U/L (ref 0–44)
AST: 30 U/L (ref 15–41)
Albumin: 4.3 g/dL (ref 3.5–5.0)
Alkaline Phosphatase: 89 U/L (ref 38–126)
Anion gap: 16 — ABNORMAL HIGH (ref 5–15)
BUN: 9 mg/dL (ref 8–23)
CO2: 19 mmol/L — ABNORMAL LOW (ref 22–32)
Calcium: 9.4 mg/dL (ref 8.9–10.3)
Chloride: 97 mmol/L — ABNORMAL LOW (ref 98–111)
Creatinine, Ser: 0.87 mg/dL (ref 0.44–1.00)
GFR, Estimated: >60 mL/min (ref >60)
Glucose, Bld: 136 mg/dL — ABNORMAL HIGH (ref 70–99)
Potassium: 3.9 mmol/L (ref 3.5–5.1)
Sodium: 132 mmol/L — ABNORMAL LOW (ref 135–145)
Total Bilirubin: 1.2 mg/dL — ABNORMAL HIGH (ref <1.2)
Total Protein: 7.9 g/dL (ref 6.5–8.1)

## 2023-04-01 LAB — CBC WITH DIFFERENTIAL/PLATELET
Abs Immature Granulocytes: 0.04 10*3/uL (ref 0.00–0.07)
Basophils Absolute: 0.1 10*3/uL (ref 0.0–0.1)
Basophils Relative: 0 %
Eosinophils Absolute: 0 10*3/uL (ref 0.0–0.5)
Eosinophils Relative: 0 %
HCT: 38.8 % (ref 36.0–46.0)
Hemoglobin: 13.8 g/dL (ref 12.0–15.0)
Immature Granulocytes: 0 %
Lymphocytes Relative: 6 %
Lymphs Abs: 0.9 10*3/uL (ref 0.7–4.0)
MCH: 32.7 pg (ref 26.0–34.0)
MCHC: 35.6 g/dL (ref 30.0–36.0)
MCV: 91.9 fL (ref 80.0–100.0)
Monocytes Absolute: 1.3 10*3/uL — ABNORMAL HIGH (ref 0.1–1.0)
Monocytes Relative: 8 %
Neutro Abs: 12.9 10*3/uL — ABNORMAL HIGH (ref 1.7–7.7)
Neutrophils Relative %: 86 %
Platelets: 209 10*3/uL (ref 150–400)
RBC: 4.22 MIL/uL (ref 3.87–5.11)
RDW: 13.1 % (ref 11.5–15.5)
WBC: 15.1 10*3/uL — ABNORMAL HIGH (ref 4.0–10.5)
nRBC: 0 % (ref 0.0–0.2)

## 2023-04-01 LAB — ETHANOL: Alcohol, Ethyl (B): <10 mg/dL (ref <10)

## 2023-04-01 MED ORDER — HYDROCODONE-ACETAMINOPHEN 5-325 MG PO TABS: ORAL_TABLET | ORAL | 0 refills | Status: AC

## 2023-04-01 MED ORDER — DILTIAZEM HCL 30 MG PO TABS
30.0000 mg | ORAL_TABLET | Freq: Once | ORAL | Status: AC
  Administered 2023-04-01: 30 mg via ORAL

## 2023-04-01 MED ORDER — FENTANYL CITRATE PF 50 MCG/ML IJ SOSY
25.0000 ug | PREFILLED_SYRINGE | Freq: Once | INTRAMUSCULAR | Status: AC
  Administered 2023-04-01: 25 ug via INTRAVENOUS
  Filled 2023-04-01: qty 1

## 2023-04-01 MED ORDER — FENTANYL CITRATE PF 50 MCG/ML IJ SOSY
50.0000 ug | PREFILLED_SYRINGE | Freq: Once | INTRAMUSCULAR | Status: DC
Start: 2023-04-01 — End: 2023-04-01

## 2023-04-01 MED ORDER — DILTIAZEM HCL 25 MG/5ML IV SOLN
10.0000 mg | Freq: Once | INTRAVENOUS | Status: AC
  Administered 2023-04-01: 10 mg via INTRAVENOUS
  Filled 2023-04-01: qty 5

## 2023-04-01 MED ORDER — SODIUM CHLORIDE 0.9 % IV BOLUS
1000.0000 mL | Freq: Once | INTRAVENOUS | Status: AC
  Administered 2023-04-01: 1000 mL via INTRAVENOUS

## 2023-04-01 MED ORDER — FENTANYL CITRATE PF 50 MCG/ML IJ SOSY
25.0000 ug | PREFILLED_SYRINGE | Freq: Once | INTRAMUSCULAR | Status: AC
Start: 2023-04-01 — End: 2023-04-01
  Administered 2023-04-01: 25 ug via INTRAVENOUS
  Filled 2023-04-01: qty 1

## 2023-04-01 MED ORDER — METOPROLOL TARTRATE 25 MG PO TABS: 25.0000 mg | ORAL_TABLET | Freq: Two times a day (BID) | ORAL | 0 refills | Status: AC

## 2023-04-01 MED ORDER — DILTIAZEM HCL 30 MG PO TABS
90.0000 mg | ORAL_TABLET | Freq: Once | ORAL | Status: DC
  Filled 2023-04-01: qty 3

## 2023-04-01 NOTE — ED Notes (Signed)
Pt denies chest pain, denies pressure, a funny feeling in her chest, denies feelings like her heart is racing, states that she feels warm/hot.  Pt only c/o L shoulder pain.

## 2023-04-01 NOTE — ED Notes (Signed)
PA and md at bedside, pt is said to be in afib with rvr, plan to treat with pain med and fluids.

## 2023-04-01 NOTE — ED Provider Notes (Signed)
[place] EMERGENCY DEPARTMENT AT [facility] Provider Note   CSN: [id number] Arrival date & time: 04/01/23  1439     History  Chief Complaint  Patient presents with   Marletta Lor    Erika Diaz is a 77 y.o. female.   Fall Pertinent negatives include no chest pain, no abdominal pain and no shortness of breath.       Erika Diaz is a 77 y.o. female with past medical history of hypertension, anxiety, and anemia who presents to the Emergency Department complaining of left elbow and shoulder pain after mechanical fall last evening.  States that she tripped over her cat and fell on her left arm.  Incident occurred in the early morning hours.  She went back to bed woke this morning with increased pain denies head injury or loss of consciousness.  EMS was called patient was given Toradol and route without improvement. On arrival here, patient was noted to have elevated heart rate, appears to be in atrial fibrillation on a cardiac monitor.  On history, noted to have hypertension and on atenolol   Home Medications Prior to Admission medications   Medication Sig Start Date End Date Taking? Authorizing Provider  atenolol (TENORMIN) 50 MG tablet Take 1 tablet (50 mg total) by mouth daily. 08/23/14   Ernestina Penna, MD  megestrol (MEGACE) 400 MG/10ML suspension Take 10 mLs (400 mg total) by mouth 2 (two) times daily. 01/06/17   Franky Macho, MD  potassium chloride (KLOR-CON) 20 MEQ packet Take 20 mEq by mouth daily. 12/22/16   Franky Macho, MD  promethazine (PHENERGAN) 25 MG tablet Take 0.5-1 tablets by mouth every 6 (six) hours as needed for nausea. 12/14/16   [provider]      Allergies    Patient has no known allergies.    Review of Systems   Review of Systems  Constitutional:  Negative for chills and fever.  Respiratory:  Negative for shortness of breath.   Cardiovascular:  Negative for chest pain.  Gastrointestinal:  Negative for abdominal pain, nausea and  vomiting.  Musculoskeletal:  Positive for arthralgias (Left shoulder and left elbow pain).  Skin:  Negative for wound.  Neurological:  Negative for dizziness, syncope and weakness.    Physical Exam Updated Vital Signs BP (!) 166/106 (BP Location: Right Arm)   Pulse (!) 174   Temp 98.3 F (36.8 C) (Oral)   Resp 18   Ht 5\' 3"  (1.6 m)   Wt 54.4 kg   SpO2 96%   BMI 21.26 kg/m  Physical Exam Vitals and nursing note reviewed.  Constitutional:      Comments: Uncomfortable appearing, tearful  HENT:     Head: Atraumatic.  Cardiovascular:     Rate and Rhythm: Tachycardia present. Rhythm irregular.     Pulses: Normal pulses.  Pulmonary:     Effort: Pulmonary effort is normal.     Breath sounds: Normal breath sounds.  Chest:     Chest wall: No tenderness.  Abdominal:     Palpations: Abdomen is soft.     Tenderness: There is no abdominal tenderness.  Musculoskeletal:        General: Tenderness and signs of injury present. No swelling or deformity.     Left shoulder: Tenderness present. Decreased range of motion.     Cervical back: Normal range of motion. No tenderness.     Comments: Tender to palpation anterior left shoulder and left clavicle area.  No bony deformity or step-off.  Patient able to perform range of motion of the left elbow without difficulty.  Skin:    General: Skin is warm.     Capillary Refill: Capillary refill takes less than 2 seconds.     Findings: No erythema.  Neurological:     General: No focal deficit present.     Mental Status: She is alert.     Sensory: No sensory deficit.     Motor: No weakness.     ED Results / Procedures / Treatments   Labs (all labs ordered are listed, but only abnormal results are displayed) Labs Reviewed  CBC WITH DIFFERENTIAL/PLATELET - Abnormal; Notable for the following components:      Result Value   WBC 15.1 (*)    Neutro Abs 12.9 (*)    Monocytes Absolute 1.3 (*)    All other components within normal limits   COMPREHENSIVE METABOLIC PANEL - Abnormal; Notable for the following components:   Sodium 132 (*)    Chloride 97 (*)    CO2 19 (*)    Glucose, Bld 136 (*)    Total Bilirubin 1.2 (*)    Anion gap 16 (*)    All other components within normal limits  ETHANOL  MAGNESIUM    EKG EKG Interpretation Date/Time:  Wednesday April 01 2023 16:30:18 EST Ventricular Rate:  99 PR Interval:  189 QRS Duration:  141 QT Interval:  398 QTC Calculation: 511 R Axis:   28  Text Interpretation: Sinus rhythm Left bundle branch block Confirmed by Estanislado Pandy [phone]) on 04/01/2023 5:04:10 PM  Radiology CT Shoulder Left Wo Contrast  Result Date: 04/01/2023 CLINICAL DATA:  Shoulder pain, fell at home yesterday EXAM: CT OF THE UPPER LEFT EXTREMITY WITHOUT CONTRAST TECHNIQUE: Multidetector CT imaging of the upper left extremity was performed according to the standard protocol. RADIATION DOSE REDUCTION: This exam was performed according to the departmental dose-optimization program which includes automated exposure control, adjustment of the mA and/or kV according to patient size and/or use of iterative reconstruction technique. COMPARISON:  04/01/2023 FINDINGS: Bones/Joint/Cartilage There is a comminuted intra-articular fracture of the medial head of the left clavicle, with near anatomic alignment. No other acute displaced fractures. There is mild glenohumeral and acromioclavicular joint osteoarthritis. Hypertrophic changes along the inferior margin of the clavicle may result in impingement syndrome, and clinical correlation is recommended. Ligaments Suboptimally assessed by CT. Muscles and Tendons There is no evidence of full-thickness rotator cuff tear, tendinous retraction, or muscular atrophy. Soft tissues Soft tissue swelling surrounding the medial left clavicle fracture. Remaining soft tissues are unremarkable. Visualized portions of the lungs are clear. Reconstructed images demonstrate no additional findings.  IMPRESSION: 1. Comminuted intra-articular medial left clavicular fracture, with near anatomic alignment. 2. Mild acromioclavicular and glenohumeral joint osteoarthritis. 3. Hypertrophic changes of the distal left clavicle may result in impingement syndrome. Please correlate with physical exam findings. 4. No evidence of full-thickness rotator cuff tear, tendinous retraction, or muscular atrophy. Electronically Signed   By: Sharlet Salina M.D.   On: 04/01/2023 20:00   DG Chest 1 View  Result Date: 04/01/2023 CLINICAL DATA:  Fall.  Left arm pain. EXAM: CHEST  1 VIEW COMPARISON:  Chest two views 12/18/2016, AP chest 12/08/2016 FINDINGS: EKG leads and a pacing pad overlie the chest. The patient is rotated to the right. This limits evaluation of the cardiac silhouette and mediastinal contours. The cardiac silhouette again appears mildly to moderately enlarged. Right chest wall porta catheter tip overlies the superior vena cava/right atrial junction.  Mild chronic interstitial thickening is grossly similar to prior. No definite acute airspace opacity is seen. No definite acute displaced left-sided rib fracture is seen. IMPRESSION: 1. Limited evaluation with rightward rotation. No definite acute airspace opacity. 2. Mild to moderate cardiomegaly, unchanged. Electronically Signed   By: Neita Garnet M.D.   On: 04/01/2023 17:09   DG Elbow Complete Left  Result Date: 04/01/2023 CLINICAL DATA:  Fall.  Left arm pain. EXAM: LEFT ELBOW - COMPLETE 3+ VIEW COMPARISON:  None Available. FINDINGS: There is diffuse decreased bone mineralization. No definite elbow joint effusion. Mild peripheral medial elbow degenerative spurring. Mild chronic enthesopathic change at the common extensor tendon origin at the lateral epicondyle. Joint spaces are preserved. No acute fracture is seen. No dislocation. IMPRESSION: 1. No acute fracture. 2. Mild chronic enthesopathic change at the common extensor tendon origin at the lateral epicondyle.  Electronically Signed   By: Neita Garnet M.D.   On: 04/01/2023 17:05   DG Shoulder Left  Result Date: 04/01/2023 CLINICAL DATA:  Fall.  Left arm pain. EXAM: LEFT SHOULDER - 2+ VIEW COMPARISON:  None available FINDINGS: Mild glenohumeral joint space narrowing. Mild inferior glenoid degenerative osteophytosis. Normal alignment of the acromioclavicular joint with mild peripheral osteophytosis. No acute fracture is seen. No dislocation. Moderate multilevel degenerative disc changes of the thoracic spine. IMPRESSION: Mild glenohumeral and acromioclavicular osteoarthritis. No acute fracture is seen. Electronically Signed   By: Neita Garnet M.D.   On: 04/01/2023 16:49    Procedures Procedures    Medications Ordered in ED Medications  sodium chloride 0.9 % bolus 1,000 mL (0 mLs Intravenous Stopped 04/01/23 1616)  fentaNYL (SUBLIMAZE) injection 25 mcg (25 mcg Intravenous Given 04/01/23 1514)  fentaNYL (SUBLIMAZE) injection 25 mcg (25 mcg Intravenous Given 04/01/23 1535)  diltiazem (CARDIZEM) injection 10 mg (10 mg Intravenous Given 04/01/23 1612)  diltiazem (CARDIZEM) injection 10 mg (10 mg Intravenous Given 04/01/23 1628)  diltiazem (CARDIZEM) tablet 30 mg (30 mg Oral Given 04/01/23 1635)    ED Course/ Medical Decision Making/ A&P                                 Medical Decision Making Patient here for evaluation of injury sustained in mechanical fall.  States she tripped over her cat last evening having pain to her left shoulder and elbow.  On arrival, patient found to be in atrial fibrillation with RVR.  Reports history of hypertension was taking atenolol but has not taken this medication in recent months.  Denies history of heart disease.  Currently denies any chest pain or shortness of breath.    Chest focal tenderness to the left shoulder and left clavicle area.  Do not appreciate any bony deformities she is able to perform range of motion of the elbow.  She neurovascularly intact.  Severe  Tachycardic, appears irregularly irregular rate in the 180s to 200 on arrival.  Amount and/or Complexity of Data Reviewed Independent Historian:     Details: Patient's daughter at bedside, endorses prior history of taking atenolol for hypertension not for rate control.  No prior history of atrial fibrillation or cardiac issues.  Has not taken atenolol in many months. Labs: ordered.    Details: Labs interpreted by me, white count elevated at 15,000, chemistries without significant derangement.  Magnesium unremarkable, blood alcohol less than 10 Radiology: ordered.    Details: Left shoulder x-ray shows osteoarthritis without evidence of fracture, elbow film negative  for acute fracture.  CT imaging of the shoulder ordered for concern of occult fracture shows comminuted intra-articular medial left clavicle fracture with near anatomic alignment. ECG/medicine tests: ordered.    Details: EKG shows extreme tachycardia with wide-complex  Repeat EKG after treatment shows sinus rhythm, left bundle branch block ventricular rate now in the 90s Discussion of management or test interpretation with external provider(s): On recheck, patient resting comfortably continues to have some shoulder and clavicle pain.  Repeat pain medication given.  Remains in sinus rhythm without significant tachycardia.  Discussed findings with cardiology, Dr.Agha who recommends metoprolol 25 mg twice daily.  Given patient's recent fall he recommends holding Eliquis for now and patient have close cardiology follow-up  I also spoke with orthopedics regarding her clavicle fracture and discussed CT findings, Dr. Carola Frost who recommends sling for now will see in office in 1 week for follow-up.  Risk Prescription drug management.   CHA2DS2-VASc score 4    CRITICAL CARE Performed by: Emaree Chiu Total critical care time: 35 minutes Critical care time was exclusive of separately billable procedures and treating other  patients. Critical care was necessary to treat or prevent imminent or life-threatening deterioration. Critical care was time spent personally by me on the following activities: development of treatment plan with patient and/or surrogate as well as nursing, discussions with consultants, evaluation of patient's response to treatment, examination of patient, obtaining history from patient or surrogate, ordering and performing treatments and interventions, ordering and review of laboratory studies, ordering and review of radiographic studies, pulse oximetry and re-evaluation of patient's condition. Care  Patient presenting here for left shoulder pain status post fall found to be in atrial fibrillation with RVR.  No history.  Required pain control IV fluids multiple dosing of diltiazem.  Converted to sinus rhythm         Final Clinical Impression(s) / ED Diagnoses Final diagnoses:  Closed nondisplaced fracture of shaft of left clavicle, initial encounter  Atrial fibrillation with RVR [facility])    Rx / DC Orders ED Discharge Orders     None         Pauline Aus, PA-C 04/01/23 2239    Coral Spikes, DO 04/02/23 0009

## 2023-04-01 NOTE — Discharge Instructions (Addendum)
You had an irregular rhythm of your heart today.  It is important that you follow-up with cardiology.  Someone from the cardiology office should contact you in the next 1 to 2 days to arrange follow-up.  Please take the metoprolol twice daily as directed.  I have also provided follow-up information for orthopedics.  You may contact their office to arrange follow-up

## 2023-04-01 NOTE — ED Notes (Signed)
EKG given to md.

## 2023-04-01 NOTE — ED Triage Notes (Signed)
Pt to er via ems, per ems pt was walking to the bathroom and she tripped over the cat.  Denies LOC.  Pt c/o L arm pain.  States that they gave 15mg  of toradol in route.

## 2023-05-14 NOTE — Progress Notes (Deleted)
Cardiology Office Note   Date:  05/14/2023   ID:  Vernita, Meinecke 1946-02-15, MRN [id number]  PCP:  Medicine, [facility]  Cardiologist:   None Referring:  ***  No chief complaint on file.     History of Present Illness: Erika Diaz is a 77 y.o. female who presents for evaluation of atrial fib.  She was diagnosed this in November.  ***       Past Medical History:  Diagnosis Date   Alcohol abuse 11/27/2013   Anemia    Anxiety    Bowel perforation (HCC)    Colon cancer (HCC)    Essential hypertension with goal blood pressure less than 140/90 10/11/2013   Fracture of femoral neck (HCC) 01/02/2014   Hip fracture (HCC) 11/28/2013   Hypertension    Hyponatremia 11/27/2013   Iron deficiency anemia 02/11/2017   Malignant neoplasm of sigmoid colon (HCC) 01/08/2017   Added automatically from request for surgery [id number]     Rectal mass 02/25/2017   Added automatically from request for surgery [id number]     S/P partial colectomy 12/08/2016   Vitamin D deficiency     Past Surgical History:  Procedure Laterality Date   BREAST LUMPECTOMY Right    lump removal    COLON SURGERY     HIP ARTHROPLASTY Right 11/28/2013   Procedure: PARTIAL RIGHT HIP REPLACEMENT (BIPOLAR);  Surgeon: Vickki Hearing, MD;  Location: [facility];  Service: Orthopedics;  Laterality: Right;   LAPAROTOMY N/A 12/08/2016   Procedure: EXPLORATORY LAPAROTOMY, PARTIAL COLECTOMY WITH COLOSTOMY;  Surgeon: Franky Macho, MD;  Location: [facility];  Service: General;  Laterality: N/A;     Current Outpatient Medications  Medication Sig Dispense Refill   HYDROcodone-acetaminophen (NORCO/VICODIN) 5-325 MG tablet Take one tab po q 4 hrs prn pain 15 tablet 0   metoprolol tartrate (LOPRESSOR) 25 MG tablet Take 1 tablet (25 mg total) by mouth 2 (two) times daily. 30 tablet 0   No current facility-administered medications for this visit.    Allergies:   Patient has no known allergies.    Social  History:  The patient  reports that she quit smoking about 18 years ago. She has never used smokeless tobacco. She reports current alcohol use. She reports that she does not use drugs.   Family History:  The patient's ***family history includes Diabetes in her mother; Stomach cancer in her mother.    ROS:  Please see the history of present illness.   Otherwise, review of systems are positive for {NONE DEFAULTED:18576}.   All other systems are reviewed and negative.    PHYSICAL EXAM: VS:  There were no vitals taken for this visit. , BMI There is no height or weight on file to calculate BMI. GENERAL:  Well appearing HEENT:  Pupils equal round and reactive, fundi not visualized, oral mucosa unremarkable NECK:  No jugular venous distention, waveform within normal limits, carotid upstroke brisk and symmetric, no bruits, no thyromegaly LYMPHATICS:  No cervical, inguinal adenopathy LUNGS:  Clear to auscultation bilaterally BACK:  No CVA tenderness CHEST:  Unremarkable HEART:  PMI not displaced or sustained,S1 and S2 within normal limits, no S3, no S4, no clicks, no rubs, *** murmurs ABD:  Flat, positive bowel sounds normal in frequency in pitch, no bruits, no rebound, no guarding, no midline pulsatile mass, no hepatomegaly, no splenomegaly EXT:  2 plus pulses throughout, no edema, no cyanosis no clubbing SKIN:  No rashes no nodules NEURO:  Cranial nerves II through XII grossly intact, motor grossly intact throughout [facility]:  Cognitively intact, oriented to person place and time    EKG:        Recent Labs: 04/01/2023: ALT 23; BUN 9; Creatinine, Ser 0.87; Hemoglobin 13.8; Magnesium 1.7; Platelets 209; Potassium 3.9; Sodium 132    Lipid Panel    Component Value Date/Time   CHOL 195 10/11/2013 1631   TRIG 58 10/11/2013 1631   HDL 118 10/11/2013 1631   CHOLHDL 1.7 10/11/2013 1631   LDLCALC 65 10/11/2013 1631      Wt Readings from Last 3 Encounters:  04/01/23 120 lb (54.4 kg)   01/06/17 107 lb (48.5 kg)  12/19/16 125 lb (56.7 kg)      Other studies Reviewed: Additional studies/ records that were reviewed today include: ***. Review of the above records demonstrates:  Please see elsewhere in the note.  ***   ASSESSMENT AND PLAN:  Atrial fib:  ***   Current medicines are reviewed at length with the patient today.  The patient {ACTIONS; HAS/DOES NOT HAVE:19233} concerns regarding medicines.  The following changes have been made:  {PLAN; NO CHANGE:13088:s}  Labs/ tests ordered today include: *** No orders of the defined types were placed in this encounter.    Disposition:   FU with ***    Signed, Rollene Rotunda, MD  05/14/2023 6:37 PM    [facility]

## 2023-05-15 ENCOUNTER — Ambulatory Visit: Payer: Medicare HMO | Admitting: Cardiology

## 2023-05-15 DIAGNOSIS — I4891 Unspecified atrial fibrillation: Secondary | ICD-10-CM

## 2023-06-05 ENCOUNTER — Ambulatory Visit: Payer: Medicare HMO | Admitting: Cardiology

## 2023-08-12 ENCOUNTER — Encounter: Payer: Self-pay | Admitting: Cardiology

## 2023-08-12 DIAGNOSIS — I480 Paroxysmal atrial fibrillation: Secondary | ICD-10-CM | POA: Insufficient documentation

## 2023-08-12 NOTE — Progress Notes (Deleted)
 Cardiology Office Note   Date:  08/12/2023   ID:  Brean, Carberry 07-Oct-1945, MRN [id number]  PCP:  Medicine, [facility]  Cardiologist:   None Referring:  ***  No chief complaint on file.     History of Present Illness: SHAUNIKA ITALIANO is a 78 y.o. female who presented to [facility] ED in Nov with new atrial fib.   I reviewed these records for this visit.  Her HR was 174.  She converted on her own to sinus.   ***   Past Medical History:  Diagnosis Date   Alcohol abuse 11/27/2013   Anemia    Anxiety    Bowel perforation (HCC)    Colon cancer (HCC)    Essential hypertension with goal blood pressure less than 140/90 10/11/2013   Fracture of femoral neck (HCC) 01/02/2014   Hip fracture (HCC) 11/28/2013   Hypertension    Hyponatremia 11/27/2013   Iron deficiency anemia 02/11/2017   Malignant neoplasm of sigmoid colon (HCC) 01/08/2017   Added automatically from request for surgery [id number]     Rectal mass 02/25/2017   Added automatically from request for surgery [id number]     S/P partial colectomy 12/08/2016   Vitamin D deficiency     Past Surgical History:  Procedure Laterality Date   BREAST LUMPECTOMY Right    lump removal    COLON SURGERY     HIP ARTHROPLASTY Right 11/28/2013   Procedure: PARTIAL RIGHT HIP REPLACEMENT (BIPOLAR);  Surgeon: Vickki Hearing, MD;  Location: [facility];  Service: Orthopedics;  Laterality: Right;   LAPAROTOMY N/A 12/08/2016   Procedure: EXPLORATORY LAPAROTOMY, PARTIAL COLECTOMY WITH COLOSTOMY;  Surgeon: Franky Macho, MD;  Location: [facility];  Service: General;  Laterality: N/A;     Current Outpatient Medications  Medication Sig Dispense Refill   HYDROcodone-acetaminophen (NORCO/VICODIN) 5-325 MG tablet Take one tab po q 4 hrs prn pain 15 tablet 0   metoprolol tartrate (LOPRESSOR) 25 MG tablet Take 1 tablet (25 mg total) by mouth 2 (two) times daily. 30 tablet 0   No current facility-administered medications for this visit.     Allergies:   Patient has no known allergies.    Social History:  The patient  reports that she quit smoking about 19 years ago. She has never used smokeless tobacco. She reports current alcohol use. She reports that she does not use drugs.   Family History:  The patient's ***family history includes Diabetes in her mother; Stomach cancer in her mother.    ROS:  Please see the history of present illness.   Otherwise, review of systems are positive for {NONE DEFAULTED:18576}.   All other systems are reviewed and negative.    PHYSICAL EXAM: VS:  There were no vitals taken for this visit. , BMI There is no height or weight on file to calculate BMI. GENERAL:  Well appearing HEENT:  Pupils equal round and reactive, fundi not visualized, oral mucosa unremarkable NECK:  No jugular venous distention, waveform within normal limits, carotid upstroke brisk and symmetric, no bruits, no thyromegaly LYMPHATICS:  No cervical, inguinal adenopathy LUNGS:  Clear to auscultation bilaterally BACK:  No CVA tenderness CHEST:  Unremarkable HEART:  PMI not displaced or sustained,S1 and S2 within normal limits, no S3, no S4, no clicks, no rubs, *** murmurs ABD:  Flat, positive bowel sounds normal in frequency in pitch, no bruits, no rebound, no guarding, no midline pulsatile mass, no hepatomegaly, no splenomegaly EXT:  2 plus  pulses throughout, no edema, no cyanosis no clubbing SKIN:  No rashes no nodules NEURO:  Cranial nerves II through XII grossly intact, motor grossly intact throughout PSYCH:  Cognitively intact, oriented to person place and time    EKG:        Recent Labs: 04/01/2023: ALT 23; BUN 9; Creatinine, Ser 0.87; Hemoglobin 13.8; Magnesium 1.7; Platelets 209; Potassium 3.9; Sodium 132    Lipid Panel    Component Value Date/Time   CHOL 195 10/11/2013 1631   TRIG 58 10/11/2013 1631   HDL 118 10/11/2013 1631   CHOLHDL 1.7 10/11/2013 1631   LDLCALC 65 10/11/2013 1631      Wt  Readings from Last 3 Encounters:  04/01/23 120 lb (54.4 kg)  01/06/17 107 lb (48.5 kg)  12/19/16 125 lb (56.7 kg)      Other studies Reviewed: Additional studies/ records that were reviewed today include: ***. Review of the above records demonstrates:  Please see elsewhere in the note.  ***   ASSESSMENT AND PLAN:  PAF: ***  HTN:  ***    Current medicines are reviewed at length with the patient today.  The patient {ACTIONS; HAS/DOES NOT HAVE:19233} concerns regarding medicines.  The following changes have been made:  {PLAN; NO CHANGE:13088:s}  Labs/ tests ordered today include: *** No orders of the defined types were placed in this encounter.    Disposition:   FU with ***    Signed, Rollene Rotunda, MD  08/12/2023 4:32 PM    [facility]

## 2023-08-13 ENCOUNTER — Ambulatory Visit: Payer: Medicare HMO | Attending: Cardiology | Admitting: Cardiology

## 2023-08-13 DIAGNOSIS — I480 Paroxysmal atrial fibrillation: Secondary | ICD-10-CM

## 2023-08-13 DIAGNOSIS — I10 Essential (primary) hypertension: Secondary | ICD-10-CM

## 2023-08-14 ENCOUNTER — Encounter: Payer: Self-pay | Admitting: Cardiology
